# Patient Record
Sex: Female | Born: 1935
Health system: Southern US, Community
[De-identification: ages and names within clinical notes are randomized; demographics above are authoritative.]

## PROBLEM LIST (undated history)

## (undated) DIAGNOSIS — I509 Heart failure, unspecified: Secondary | ICD-10-CM

## (undated) DIAGNOSIS — I10 Essential (primary) hypertension: Secondary | ICD-10-CM

## (undated) DIAGNOSIS — M1990 Unspecified osteoarthritis, unspecified site: Secondary | ICD-10-CM

## (undated) DIAGNOSIS — N184 Chronic kidney disease, stage 4 (severe): Secondary | ICD-10-CM

## (undated) DIAGNOSIS — K219 Gastro-esophageal reflux disease without esophagitis: Secondary | ICD-10-CM

## (undated) DIAGNOSIS — F0390 Unspecified dementia without behavioral disturbance: Secondary | ICD-10-CM

## (undated) HISTORY — PX: CHOLECYSTECTOMY: SHX55

---

## 1997-09-03 ENCOUNTER — Encounter (HOSPITAL_COMMUNITY): Admission: RE | Admit: 1997-09-03 | Discharge: 1997-12-02 | Payer: Self-pay | Admitting: Internal Medicine

## 1998-01-14 ENCOUNTER — Ambulatory Visit (HOSPITAL_COMMUNITY): Admission: RE | Admit: 1998-01-14 | Discharge: 1998-01-14 | Payer: Self-pay | Admitting: Obstetrics & Gynecology

## 1998-01-22 ENCOUNTER — Other Ambulatory Visit: Admission: RE | Admit: 1998-01-22 | Discharge: 1998-01-22 | Payer: Self-pay | Admitting: Obstetrics & Gynecology

## 1998-07-18 ENCOUNTER — Ambulatory Visit (HOSPITAL_COMMUNITY): Admission: RE | Admit: 1998-07-18 | Discharge: 1998-07-18 | Payer: Self-pay | Admitting: Neurology

## 1998-07-18 ENCOUNTER — Encounter: Payer: Self-pay | Admitting: Pediatrics

## 1998-07-18 ENCOUNTER — Emergency Department (HOSPITAL_COMMUNITY): Admission: EM | Admit: 1998-07-18 | Discharge: 1998-07-18 | Payer: Self-pay | Admitting: Emergency Medicine

## 1998-07-20 ENCOUNTER — Ambulatory Visit (HOSPITAL_COMMUNITY): Admission: RE | Admit: 1998-07-20 | Discharge: 1998-07-20 | Payer: Self-pay | Admitting: Pediatrics

## 1998-07-21 ENCOUNTER — Ambulatory Visit (HOSPITAL_COMMUNITY): Admission: RE | Admit: 1998-07-21 | Discharge: 1998-07-21 | Payer: Self-pay | Admitting: Pediatrics

## 1998-07-23 ENCOUNTER — Ambulatory Visit (HOSPITAL_COMMUNITY): Admission: RE | Admit: 1998-07-23 | Discharge: 1998-07-23 | Payer: Self-pay | Admitting: Pediatrics

## 1998-07-30 ENCOUNTER — Ambulatory Visit (HOSPITAL_COMMUNITY): Admission: RE | Admit: 1998-07-30 | Discharge: 1998-07-30 | Payer: Self-pay | Admitting: Pediatrics

## 1999-01-19 ENCOUNTER — Encounter: Payer: Self-pay | Admitting: Obstetrics & Gynecology

## 1999-01-19 ENCOUNTER — Ambulatory Visit (HOSPITAL_COMMUNITY): Admission: RE | Admit: 1999-01-19 | Discharge: 1999-01-19 | Payer: Self-pay | Admitting: Obstetrics & Gynecology

## 1999-01-20 ENCOUNTER — Encounter: Payer: Self-pay | Admitting: Obstetrics & Gynecology

## 1999-01-20 ENCOUNTER — Ambulatory Visit (HOSPITAL_COMMUNITY): Admission: RE | Admit: 1999-01-20 | Discharge: 1999-01-20 | Payer: Self-pay | Admitting: Obstetrics & Gynecology

## 1999-01-22 ENCOUNTER — Other Ambulatory Visit: Admission: RE | Admit: 1999-01-22 | Discharge: 1999-01-22 | Payer: Self-pay | Admitting: Obstetrics and Gynecology

## 2000-02-15 ENCOUNTER — Ambulatory Visit (HOSPITAL_COMMUNITY): Admission: RE | Admit: 2000-02-15 | Discharge: 2000-02-15 | Payer: Self-pay | Admitting: Obstetrics and Gynecology

## 2000-02-15 ENCOUNTER — Encounter: Payer: Self-pay | Admitting: Obstetrics and Gynecology

## 2000-03-01 ENCOUNTER — Encounter: Payer: Self-pay | Admitting: Internal Medicine

## 2000-03-01 ENCOUNTER — Encounter: Admission: RE | Admit: 2000-03-01 | Discharge: 2000-03-01 | Payer: Self-pay | Admitting: Internal Medicine

## 2000-03-31 ENCOUNTER — Other Ambulatory Visit: Admission: RE | Admit: 2000-03-31 | Discharge: 2000-03-31 | Payer: Self-pay | Admitting: Obstetrics and Gynecology

## 2000-04-30 ENCOUNTER — Encounter: Payer: Self-pay | Admitting: Internal Medicine

## 2000-04-30 ENCOUNTER — Inpatient Hospital Stay (HOSPITAL_COMMUNITY): Admission: EM | Admit: 2000-04-30 | Discharge: 2000-05-12 | Payer: Self-pay | Admitting: Emergency Medicine

## 2000-04-30 ENCOUNTER — Encounter (INDEPENDENT_AMBULATORY_CARE_PROVIDER_SITE_OTHER): Payer: Self-pay | Admitting: *Deleted

## 2000-04-30 ENCOUNTER — Encounter: Payer: Self-pay | Admitting: Emergency Medicine

## 2000-05-01 ENCOUNTER — Encounter: Payer: Self-pay | Admitting: Internal Medicine

## 2000-05-12 ENCOUNTER — Encounter: Payer: Self-pay | Admitting: Internal Medicine

## 2000-05-12 ENCOUNTER — Inpatient Hospital Stay: Admission: RE | Admit: 2000-05-12 | Discharge: 2000-05-18 | Payer: Self-pay | Admitting: Internal Medicine

## 2000-08-28 ENCOUNTER — Encounter: Payer: Self-pay | Admitting: Emergency Medicine

## 2000-08-28 ENCOUNTER — Inpatient Hospital Stay (HOSPITAL_COMMUNITY): Admission: EM | Admit: 2000-08-28 | Discharge: 2000-08-31 | Payer: Self-pay | Admitting: Emergency Medicine

## 2000-08-29 ENCOUNTER — Encounter: Payer: Self-pay | Admitting: Internal Medicine

## 2000-08-30 ENCOUNTER — Encounter: Payer: Self-pay | Admitting: Internal Medicine

## 2000-08-31 ENCOUNTER — Encounter: Payer: Self-pay | Admitting: Internal Medicine

## 2000-09-02 ENCOUNTER — Encounter: Payer: Self-pay | Admitting: Emergency Medicine

## 2000-09-02 ENCOUNTER — Inpatient Hospital Stay (HOSPITAL_COMMUNITY): Admission: EM | Admit: 2000-09-02 | Discharge: 2000-09-06 | Payer: Self-pay | Admitting: Emergency Medicine

## 2000-09-03 ENCOUNTER — Encounter: Payer: Self-pay | Admitting: Internal Medicine

## 2000-09-04 ENCOUNTER — Encounter: Payer: Self-pay | Admitting: Internal Medicine

## 2004-03-22 ENCOUNTER — Emergency Department (HOSPITAL_COMMUNITY): Admission: EM | Admit: 2004-03-22 | Discharge: 2004-03-22 | Payer: Self-pay | Admitting: Emergency Medicine

## 2006-09-02 ENCOUNTER — Ambulatory Visit: Payer: Self-pay | Admitting: Gastroenterology

## 2006-09-02 LAB — CONVERTED CEMR LAB
AST: 121 units/L — ABNORMAL HIGH (ref 0–37)
Amylase: 57 units/L (ref 27–131)
BUN: 18 mg/dL (ref 6–23)
Basophils Relative: 0.5 % (ref 0.0–1.0)
Calcium: 9.2 mg/dL (ref 8.4–10.5)
Chloride: 106 meq/L (ref 96–112)
Creatinine, Ser: 1.7 mg/dL — ABNORMAL HIGH (ref 0.4–1.2)
Eosinophils Absolute: 0.2 10*3/uL (ref 0.0–0.6)
GFR calc non Af Amer: 31 mL/min
Glucose, Bld: 78 mg/dL (ref 70–99)
Lipase: 29 units/L (ref 11.0–59.0)
Lymphocytes Relative: 26.3 % (ref 12.0–46.0)
MCHC: 34.9 g/dL (ref 30.0–36.0)
MCV: 89.3 fL (ref 78.0–100.0)
Monocytes Relative: 11.7 % — ABNORMAL HIGH (ref 3.0–11.0)
RBC: 5 M/uL (ref 3.87–5.11)
Sed Rate: 58 mm/hr — ABNORMAL HIGH (ref 0–25)
Sodium: 142 meq/L (ref 135–145)
Total Bilirubin: 2.1 mg/dL — ABNORMAL HIGH (ref 0.3–1.2)
Vitamin B-12: 1335 pg/mL — ABNORMAL HIGH (ref 211–911)
WBC: 7.5 10*3/uL (ref 4.5–10.5)

## 2006-09-07 ENCOUNTER — Ambulatory Visit: Payer: Self-pay | Admitting: Gastroenterology

## 2006-09-08 ENCOUNTER — Ambulatory Visit: Payer: Self-pay | Admitting: Gastroenterology

## 2006-09-12 ENCOUNTER — Encounter: Payer: Self-pay | Admitting: Gastroenterology

## 2006-09-13 ENCOUNTER — Inpatient Hospital Stay (HOSPITAL_COMMUNITY): Admission: RE | Admit: 2006-09-13 | Discharge: 2006-09-18 | Payer: Self-pay | Admitting: Gastroenterology

## 2006-09-13 ENCOUNTER — Encounter (INDEPENDENT_AMBULATORY_CARE_PROVIDER_SITE_OTHER): Payer: Self-pay | Admitting: Specialist

## 2006-09-15 ENCOUNTER — Ambulatory Visit: Payer: Self-pay | Admitting: Gastroenterology

## 2006-11-01 ENCOUNTER — Ambulatory Visit: Payer: Self-pay | Admitting: Gastroenterology

## 2008-11-13 ENCOUNTER — Ambulatory Visit: Payer: Self-pay | Admitting: Vascular Surgery

## 2009-05-16 ENCOUNTER — Emergency Department (HOSPITAL_COMMUNITY): Admission: EM | Admit: 2009-05-16 | Discharge: 2009-05-16 | Payer: Self-pay | Admitting: Emergency Medicine

## 2009-11-08 ENCOUNTER — Inpatient Hospital Stay (HOSPITAL_COMMUNITY): Admission: EM | Admit: 2009-11-08 | Discharge: 2009-11-12 | Payer: Self-pay | Admitting: Emergency Medicine

## 2009-11-08 ENCOUNTER — Ambulatory Visit: Payer: Self-pay | Admitting: Gastroenterology

## 2009-11-08 ENCOUNTER — Encounter: Payer: Self-pay | Admitting: Gastroenterology

## 2009-11-09 ENCOUNTER — Encounter: Payer: Self-pay | Admitting: Gastroenterology

## 2009-11-12 ENCOUNTER — Encounter (INDEPENDENT_AMBULATORY_CARE_PROVIDER_SITE_OTHER): Payer: Self-pay | Admitting: *Deleted

## 2010-07-07 NOTE — Procedures (Signed)
Summary: ERCP  Patient: [name] Note: All result statuses are Final unless otherwise noted.  Tests: (1) ERCP (ERC)   ERC ERCP                  DONE     [facility]     [address]     [place], [place]  [address]           ERCP PROCEDURE REPORT           PATIENT:  [name], [name]  MR#:  [id number]     BIRTHDATE:  10/08/1923  GENDER:  female     ENDOSCOPIST:  Rachael Fee, MD     PROCEDURE DATE:  11/09/2009     PROCEDURE:  ERCP with removal of stones, ERCP with sphincterotomy     INDICATIONS:  h/o CBD stones (ERCP with DR. Arlyce Dice in 2008,     followed by lap chole); now with acute abd pains, elevated LFTs,     MRCP confirming CBD stone/debris           MEDICATIONS:  Fentanyl 80 mcg IV, Versed 8 mg IV, Benadryl 25 mg     IV, cipro 400mg  IV     TOPICAL ANESTHETIC:  none           DESCRIPTION OF PROCEDURE:   After the risks benefits and     alternatives of the procedure were thoroughly explained, informed     consent was obtained.  The [id number]-[id number] ([id number]) endoscope was     introduced through the mouth and advanced to the second portion of     the duodenum without detailed examination of the UGI tract. The     major papilla showed evidence of previous sphincterotomy.  The     bile duct was cannulated using a 44 Autotome over a .035 hydrawire     and then contrast was injected.  Cholangiogram revealed large,     mobile filling defect in 12mm CBD.  Intrahepatic were slightly     dilated.  The previous biliary sphincterotomy was extended and     then a biliary balloon was then used to sweep a large amount of     soft, greenish debris/amorphous stone from the bile duct into the     duodenum.  Severeal sweeps with the balloon were performed,     however before completion, occlusion cholangiogram was possible     she began to desaturate and the scope was then completely     withdrawn from the patient and the procedure terminated.  The main     pancreatic  duct was never cannulated nor injected with dye.           <<PROCEDUREIMAGES>>           Impression:     CBD stone/large amount of semi-formed debris was swept from bile     duct following extension of the previous biliary sphincterotomy.     She began to desaturate prior to completion, occlusion     cholangiogram but I think that the vast majority, if not all, of     the stone/debris was swept from duct.           Plan:     She should stay on IV cipro another 24 to 48 hours.  Will follow     liver tests and clinical progress.           ______________________________     Chloe Alar.  Chloe Hartigan, MD           cc: Melvia Heaps, MD           n.     Rosalie Doctor:   Rachael Fee at 11/09/2009 11:11 AM           [place], [place], [phone]  Note: An exclamation mark (!) indicates a result that was not dispersed into the flowsheet. Document Creation Date: 11/09/2009 11:11 AM _______________________________________________________________________  (1) Order result status: Final Collection or observation date-time: 11/09/2009 10:59 Requested date-time:  Receipt date-time:  Reported date-time:  Referring Physician:   Ordering Physician: Rob Bunting [phone]) Specimen Source:  Source: Launa Grill Order Number: [id number] Lab site:

## 2010-07-07 NOTE — Procedures (Signed)
Summary: ERCP   ERCP  Procedure date:  09/12/2006  Findings:      abnormal:  Location: [facility].   ERCP  Procedure date:  09/12/2006  Findings:      abnormal:  Location: [facility].  Patient Name: [name], [name] v. MRN:  Procedure Procedures: Therapeutic Endoscopic Retrograde Cholangiopancreatography CPT: 43260.    with SphincterotomyCPT: [id number].    With Stone Removal CPT: S6289224.  Personnel: Endoscopist: Barbette Hair. Arlyce Dice, MD.  Indications Symptoms: Abdominal pain.  History  Current Medications: Patient is not currently taking Coumadin.  Pre-Exam Physical: Performed Sep 12, 2006. Entire physical exam was normal.  Exam Monitoring: Pulse and BP monitoring done. Oximetry used. Supplemental O2 given. at 2 Liters.  Sedation Meds: Robinul 0.2 given IV. Versed 10 mg. given IV. Fentanyl 140 given IV. Cetacaine Spray 2 sprays given aerosolized. Glucagon 1 mg. given IV.  Pre-Op Antibiotics: Cipro/Ciprofloxacin 400 mg daily.  Fluoroscopy: Fluoroscopy was used.  VISUALIZATION  Ducts: Common Bile Duct, Left Hepatic Duct, Right Hepatic Duct visualized. Intrahepatic DuctsPancreatic Duct not sought.  Findings STONE(S) : Common Bile Duct,  ICD9: Choledocholithiasis: 574.50. Comments: At least 4 5-70mm filling defects.  Following a 13mm sphincterotomy, stones were extracted with a 12.35mm balloon extraction basket.  STONE(S) : Common Bile Duct,  ICD9: Choledocholithiasis: 574.50.  STONE(S) : [typed in],  ICD9: Choledocholithiasis: 574.50.  STONE(S) : Common Bile Duct,  ICD9: Choledocholithiasis: 574.50.   Assessment ERCP: Exam complete, Abnormal examination, see findings above.  Biliary Tract: Exam complete. Abnormal examination, see findings above.  Events  Unplanned Intervention: No intervention was required.  Unplanned Events: There were no complications. Plans Medication Plan: Continue current medications.  Scheduling: Surgery, on Sep 13, 2006.    This report was created from the original endoscopy report, which was reviewed and signed by the above listed endoscopist.

## 2010-07-07 NOTE — Discharge Summary (Signed)
Summary: Jaundice   NAME:  [name], [name]              ACCOUNT NO.:  [id number]      MEDICAL RECORD NO.:  [id number]          PATIENT TYPE:  INP      LOCATION:  5502                         FACILITY:  [facility]      PHYSICIAN:  Hind I Elsaid, MD      DATE OF BIRTH:  1923/06/12      DATE OF ADMISSION:  11/07/2009   DATE OF DISCHARGE:  11/11/2009                                  DISCHARGE SUMMARY      PRIMARY CARE PHYSICIAN:  Cala Bradford R. Renae Gloss, MD      DISCHARGE DIAGNOSES:   1. Jaundice felt to be secondary to common bile duct cholelithiasis       and cholecystitis.   2. Leukocytosis felt to be secondary to jaundice.   3. Fever felt to be secondary to jaundice.   4. Chronic renal insufficiency.   5. History of Bell palsy.   6. History of benign breast mass.   7. History of de Quervain thyroiditis.   8. History of hypertension.   9. History of hypercholesterolemia.   10.History of congestive heart failure.   11.Hypoxia during this hospital visit felt to be secondary to fluid       overload.      DISCHARGE MEDICATIONS:   1. Norvasc 10 mg daily.   2. Tramadol 50 mg p.o. b.i.d. p.r.n.   3. Lasix 20 mg p.o. daily.   4. Simvastatin 20 mg daily.   5. Lopressor 50 mg b.i.d.   6. Prilosec 20 mg daily.   7. Cipro 500 mg p.o. b.i.d. for 10 days.      CONSULTATION:  Gastroenterology consulted.      PROCEDURE:   1. CT abdomen and pelvis, status post cholecystectomy with intra and       extrahepatic biliary ductal dilatation.  This can be seen after       cholecystectomy.  However, correlation with LFTs and bilirubin is       recommended.   2. Hiatal hernia with fluid with distal thoracic esophagus adjusting       gastroesophageal reflux.   3. Anterior right lower lobe collapse/consolidation suggesting       pneumonia.   4. Chest x-ray, cardiomegaly was diffused interstitial intubation most       consistent with interstitial edema, most likely superimposed       interstitial fibrotic  changes and bilateral.   5. Status post MRCP.   6. Status post ERCP.  MRCP did show stone on the common bile duct       causing intra and extrahepatic biliary dilatation.      HISTORY OF PRESENT ILLNESS:  This is an 77 year old female with history   of hypercholesterolemia who present with 2-day history of abdominal   pain, nausea, and vomiting.  Denies any fever, chills, or diarrhea.  She   is found to have elevated liver function test along with elevated   alkaline phosphatase.  Her CT scan show no masses.  On abdominal exam   had kind of tenderness at the left  upper quadrant.  The patient admitted   for old jaundice to the hospital.  An MRCP was done, which shows common   bile stone.  The patient covered with Cipro IV, and blood culture was   negative.  After the ERCP, the patient's jaundice gradually improving.   The patient noticed to have significant leukocytosis 20,000, which felt   could be secondary to her cholecystitis, but cannot rule out pneumonia.   I repeat chest x-ray suggestive of pulmonary or interstitial edema   rather than pneumonia.  Any how, the patient covered with Cipro IV and   the patient received Lasix IV.  Our plan to answer the patient 24 hours   for decrease on the patient white blood cells, and weaning off oxygen.   To continue with Cipro, the patient anticipates to be discharged   tomorrow.  Further medication or further recommendation can be added to   the above discharge summary per the discharging MD.               Hind Bosie Helper, MD            HIE/MEDQ  D:  11/11/2009  T:  11/12/2009  Job:  [id number]      Electronically Signed by Ebony Cargo MD on 12/23/2009 11:45:56 AM

## 2010-08-24 LAB — CBC
HCT: 37.2 % (ref 36.0–46.0)
HCT: 38.5 % (ref 36.0–46.0)
HCT: 39.7 % (ref 36.0–46.0)
HCT: 49.6 % — ABNORMAL HIGH (ref 36.0–46.0)
Hemoglobin: 13.6 g/dL (ref 12.0–15.0)
Hemoglobin: 13.8 g/dL (ref 12.0–15.0)
Hemoglobin: 14.1 g/dL (ref 12.0–15.0)
Hemoglobin: 17.3 g/dL — ABNORMAL HIGH (ref 12.0–15.0)
MCHC: 34.3 g/dL (ref 30.0–36.0)
MCHC: 34.8 g/dL (ref 30.0–36.0)
MCV: 89.7 fL (ref 78.0–100.0)
MCV: 90.6 fL (ref 78.0–100.0)
MCV: 90.7 fL (ref 78.0–100.0)
MCV: 90.9 fL (ref 78.0–100.0)
Platelets: 123 10*3/uL — ABNORMAL LOW (ref 150–400)
Platelets: 137 10*3/uL — ABNORMAL LOW (ref 150–400)
Platelets: 172 10*3/uL (ref 150–400)
RDW: 13.9 % (ref 11.5–15.5)
RDW: 14.7 % (ref 11.5–15.5)
RDW: 14.9 % (ref 11.5–15.5)
RDW: 15.1 % (ref 11.5–15.5)
WBC: 17.4 10*3/uL — ABNORMAL HIGH (ref 4.0–10.5)

## 2010-08-24 LAB — URINALYSIS, MICROSCOPIC ONLY
Ketones, ur: NEGATIVE mg/dL
Nitrite: NEGATIVE
Protein, ur: NEGATIVE mg/dL

## 2010-08-24 LAB — PROTIME-INR
INR: 1.06 (ref 0.00–1.49)
Prothrombin Time: 13.7 s (ref 11.6–15.2)

## 2010-08-24 LAB — COMPREHENSIVE METABOLIC PANEL
ALT: 138 U/L — ABNORMAL HIGH (ref 0–35)
ALT: 262 U/L — ABNORMAL HIGH (ref 0–35)
AST: 110 U/L — ABNORMAL HIGH (ref 0–37)
Albumin: 2.4 g/dL — ABNORMAL LOW (ref 3.5–5.2)
Albumin: 4.1 g/dL (ref 3.5–5.2)
BUN: 21 mg/dL (ref 6–23)
CO2: 24 mEq/L (ref 19–32)
Calcium: 7.9 mg/dL — ABNORMAL LOW (ref 8.4–10.5)
Chloride: 107 mEq/L (ref 96–112)
Chloride: 98 mEq/L (ref 96–112)
Creatinine, Ser: 1.61 mg/dL — ABNORMAL HIGH (ref 0.4–1.2)
Creatinine, Ser: 1.79 mg/dL — ABNORMAL HIGH (ref 0.4–1.2)
GFR calc Af Amer: 26 mL/min — ABNORMAL LOW (ref >60)
GFR calc non Af Amer: 22 mL/min — ABNORMAL LOW (ref >60)
GFR calc non Af Amer: 30 mL/min — ABNORMAL LOW (ref >60)
Potassium: 3.4 mEq/L — ABNORMAL LOW (ref 3.5–5.1)
Potassium: 3.7 mEq/L (ref 3.5–5.1)
Sodium: 135 mEq/L (ref 135–145)
Sodium: 140 mEq/L (ref 135–145)
Total Bilirubin: 9.1 mg/dL — ABNORMAL HIGH (ref 0.3–1.2)
Total Protein: 5.5 g/dL — ABNORMAL LOW (ref 6.0–8.3)
Total Protein: 8.3 g/dL (ref 6.0–8.3)

## 2010-08-24 LAB — HEPATITIS PANEL, ACUTE
HCV Ab: NEGATIVE
Hep A IgM: NEGATIVE
Hep B C IgM: NEGATIVE
Hepatitis B Surface Ag: NEGATIVE

## 2010-08-24 LAB — BASIC METABOLIC PANEL
BUN: 17 mg/dL (ref 6–23)
BUN: 23 mg/dL (ref 6–23)
Calcium: 8.6 mg/dL (ref 8.4–10.5)
Chloride: 111 mEq/L (ref 96–112)
Creatinine, Ser: 1.68 mg/dL — ABNORMAL HIGH (ref 0.4–1.2)
GFR calc non Af Amer: 29 mL/min — ABNORMAL LOW (ref >60)
Glucose, Bld: 96 mg/dL (ref 70–99)
Glucose, Bld: 98 mg/dL (ref 70–99)
Potassium: 3.5 mEq/L (ref 3.5–5.1)
Sodium: 142 mEq/L (ref 135–145)

## 2010-08-24 LAB — CULTURE, BLOOD (ROUTINE X 2)
Culture: NO GROWTH
Culture: NO GROWTH

## 2010-08-24 LAB — HEPATIC FUNCTION PANEL
ALT: 85 U/L — ABNORMAL HIGH (ref 0–35)
AST: 33 U/L (ref 0–37)
Albumin: 2.5 g/dL — ABNORMAL LOW (ref 3.5–5.2)
Bilirubin, Direct: 4.4 mg/dL — ABNORMAL HIGH (ref 0.0–0.3)
Indirect Bilirubin: 3.5 mg/dL — ABNORMAL HIGH (ref 0.3–0.9)
Total Bilirubin: 7.9 mg/dL — ABNORMAL HIGH (ref 0.3–1.2)
Total Protein: 6.3 g/dL (ref 6.0–8.3)

## 2010-08-24 LAB — URINE CULTURE
Colony Count: NO GROWTH
Culture: NO GROWTH

## 2010-08-24 LAB — URINALYSIS, ROUTINE W REFLEX MICROSCOPIC
Glucose, UA: 100 mg/dL — AB
Nitrite: NEGATIVE
Protein, ur: 100 mg/dL — AB
Urobilinogen, UA: 1 mg/dL (ref 0.0–1.0)

## 2010-08-24 LAB — DIFFERENTIAL
Basophils Absolute: 0 10*3/uL (ref 0.0–0.1)
Basophils Relative: 0 % (ref 0–1)
Eosinophils Absolute: 0 10*3/uL (ref 0.0–0.7)
Neutro Abs: 9.5 10*3/uL — ABNORMAL HIGH (ref 1.7–7.7)

## 2010-08-24 LAB — LIPASE, BLOOD: Lipase: 22 U/L (ref 11–59)

## 2010-08-24 LAB — GLUCOSE, CAPILLARY
Glucose-Capillary: 135 mg/dL — ABNORMAL HIGH (ref 70–99)
Glucose-Capillary: 143 mg/dL — ABNORMAL HIGH (ref 70–99)

## 2010-08-24 LAB — HEPATITIS B SURFACE ANTIGEN: Hepatitis B Surface Ag: NEGATIVE

## 2010-08-24 LAB — URINE MICROSCOPIC-ADD ON

## 2010-09-08 LAB — URINE CULTURE: Culture: NO GROWTH

## 2010-09-08 LAB — URINALYSIS, ROUTINE W REFLEX MICROSCOPIC
Glucose, UA: NEGATIVE mg/dL
Hgb urine dipstick: NEGATIVE
Specific Gravity, Urine: 1.015 (ref 1.005–1.030)
pH: 7.5 (ref 5.0–8.0)

## 2010-10-20 NOTE — Consult Note (Signed)
NEW PATIENT CONSULTATION   [name], CAPETILLO  DOB:  [birth year]                                       11/13/2008  HYQMV#:[id number]   The patient presents today for evaluation of bilateral lower extremity  venous pathology.  She is a pleasant 77 year old white female with a  long history of venous pathology.  She is status post bilateral vein  stripping in [place] at least 30 years ago.  She had noticed over the  last several years to have increasing tributary varicosities over her  right calf and has an area of particular concern with a small varicosity  that is very prominent in her medial calf.  She denies any prior history  of deep vein thrombosis, bleeding, ulceration, or superficial  thrombophlebitis.  She does have significant bilateral calf swelling and  is having some improvement with diuretic treatment.  She does have a  history of high blood pressure and elevated cholesterol.   SOCIAL HISTORY:  She is widowed.  She does not smoke or drink alcohol.  She does have a family history of venous varicosities.   REVIEW OF SYSTEMS:  Positive for urinary frequency, leg pain, arthritic  joint pain, hearing loss.   MEDICATIONS:  Prilosec, amlodipine, Lopressor, Darvocet, simvastatin,  microbid, Lasix, Fosamax.   PHYSICAL EXAM:  Well-developed, moderately obese white female appearing  stated age of 57.  Blood pressure is 136/16, pulse 58, respirations 18.  Radial pulses are 2+.  She has 2+ dorsalis pedis pulses bilaterally.  She does have significant bilateral lower extremity pitting edema and  does have multiple prior incisions from multiple prior tributary  removal.  She also has an incision at her saphenous vein at her ankles  bilaterally.  She has a few scattered telangiectasia and varices over  her left leg.  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   She underwent formal venous duplex in our office  that shows no evidence  of deep vein thrombosis on the right leg.  She does have reflux in her  great saphenous vein and tributary veins.  On the left leg, I do not see  any evidence of saphenous vein, appears to be surgically absent.  On the  right leg she does have surgically absent saphenous vein from the knee  distal to the ankle.  I discussed options with the patient and her  daughter present.  I did explain the importance of continued diuretic  treatment, elevation, and compression garments.  She does have knee-high  compression garments, but has typical difficulty in placing these on.  I  did explain the option of ablation of her saphenous vein and stab  phlebectomy of tributary varicosities should she have progressive  symptoms, but these currently are not severe.  She understood this  discussion.  I did explain that she does not have any evidence of any  danger related to this, and therefore would recommend conservative  treatment.  She will notify us should she have any progressive symptoms.   Larina Earthly, M.D.  Electronically Signed   TFE/MEDQ  D:  11/13/2008  T:  11/13/2008  Job:  [id number]   cc:   Merlene Laughter. Renae Gloss, M.D.

## 2010-10-20 NOTE — Procedures (Signed)
LOWER EXTREMITY VENOUS REFLUX EXAM   INDICATION:  Right lower extremity varicose vein.   EXAM:  Using color-flow imaging and pulse Doppler spectral analysis, the  right common femoral, superficial femoral, popliteal, posterior tibial,  greater and lesser saphenous veins are evaluated.  There is no evidence  suggesting deep venous insufficiency in the right lower extremity.   The right saphenofemoral junction is small in size.  The right GSV is  not competent from proximal to knee level with the caliber as described  below.   The right proximal short saphenous vein demonstrates incompetency with  caliber ranging from 0.56 to 0.33 cm.   GSV Diameter (used if found to be incompetent only)                                            Right    Left  Proximal Greater Saphenous Vein           0.58 cm  cm  Proximal-to-mid-thigh                     cm       cm  Mid thigh                                 0.45 cm  cm  Mid-distal thigh                          cm       cm  Distal thigh                              0.32 cm  cm  Knee                                      0.36 cm  cm   IMPRESSION:  1. The right greater saphenous vein reflux is identified with the      caliber ranging from 0.58 cm to 0.32 cm knee to proximal thigh.  2. The right greater saphenous vein is not aneurysmal.  3. The right greater saphenous vein is not tortuous.  4. The deep venous system is competent.  5. The right lesser saphenous vein is not competent.        ___________________________________________  Larina Earthly, M.D.   AC/MEDQ  D:  11/13/2008  T:  11/13/2008  Job:  [id number]

## 2010-10-23 NOTE — H&P (Signed)
[place]  Patient:    [name], [name]                     MRN: [id number] Adm. Date:  [id number] Attending:  Olene Craven CC:         Kern Reap, M.D.   History and Physical  CHIEF COMPLAINT: Congestive heart failure.  HISTORY OF PRESENT ILLNESS: This 77 year old patient has a long history of multiple medical problems and was hospitalized in November and December 2001 here at [facility] for Escherichia coli pyelonephritis and sepsis with acute renal failure, diabetes, hypothyroidism, electrolyte disorder, and hypertension.  She had transient elevations of liver enzymes. She had a coagulopathy from Coumadin and it was discontinued.  She has a previous history of deep vein thrombosis and pulmonary embolus.  She has a history of osteoarthritis and a history of dyspepsia and Bells palsy.  ALLERGIES:  1. PENICILLIN.  2. SULFA.  PAST MEDICAL HISTORY: The complexity of her past history is outlined in the notes at [facility].  ACUTE PROBLEM: Shortness of breath.  SUBJECTIVE: This patient woke up this morning without chest pain and was short of breath.  She had no leg pain, no cough, no coughing up blood or hemoptysis or wheezing.  She was seen by the emergency room staff here at [facility] and found to be in congestive heart failure, and I was called to admit the patient.  PHYSICAL EXAMINATION:  VITAL SIGNS: Blood pressure 135/70, respirations 18 and unlabored, pulse 75 and regular, temperature normal.  HEENT: Head normal.  PERRL.  EOMI.  ENT examination was unremarkable for any significant abnormalities.  Funduscopic examination was negative.  The patient does not have any lower teeth.  She did have bilateral cerumen impactions.  NECK: Supple, full range of motion.  Carotids were extremely weak bilaterally but there were no carotid bruits noted.  There were no neck  masses.  The thyroid was unremarkable.  There was no jugular venous distention.  CHEST: Bilateral rales in the mid chest noted.  BREAST: Unremarkable.  CARDIOVASCULAR: Rate and rhythm normal without murmur or gallop.  ABDOMEN: Soft, flat, nontender.  No organomegaly or abnormal masses.  Bowel sounds normal.  No abdominal bruits.  Postsurgical scars noted.  SKIN: Warm and dry.  No evidence of jaundice.  JOINT SURVEY: Hypertrophic changes, specifically of both knees.  The right knee had full extension and flexion to 90 degrees with mild discomfort.  The left knee had full extension but flexion only to about 5 degrees with discomfort.  There was no acute inflammatory reaction or effusion.  The patient is status post bilateral venous ligation and stripping.  There is 1+ pretibial edema bilaterally.  Arterial circulation was 2+ bilaterally.  NEUROLOGIC: Negative.  RECTAL/PELVIC: Examinations are deferred.  LABORATORY DATA: EKG showed no acute changes.  Chest x-ray showed pulmonary edema.  Initial laboratory work was abnormal with a BUN of 35, creatinine 1.6.  The glucose was good at 139.  CBC, troponin, and CK are all pending.  ASSESSMENT: Acute pulmonary edema, rule out acute myocardial infarction silently as an etiology.  Certainly no evidence of pulmonary embolus.  PLAN: Admit the patient and diurese, ACE inhibitor, rule out MI. DD:  08/28/00 TD:  08/29/00 Job: [id number]

## 2010-10-23 NOTE — H&P (Signed)
[place]. [facility]  Patient:    [name], [name]                     MRN: [id number] Adm. Date:  [id number] Disc. Date: [id number] Attending:  Rosanne Sack                         History and Physical  DATE OF BIRTH:  1924-05-25.  PROBLEM LIST  1. Recent Escherichia coli pyelonephritis with septicemia, complicated with     acute renal failure.     a. Day 12 out of 14 of antibiotic therapy.  2. Acute-on-chronic renal failure.     a. Multifactorial etiology -- pyelonephritis, prerenal azotemia,        nonsteroidal anti-inflammatory drug use, hypertension and diabetes.     b. Current creatinine 1.8; BUN 20.     c. Protein of 1500 mg on a 24-hour urine collection for protein.     d. Negative C3, C4, antinuclear antibodies and urine for eosinophils.     e. Renal ultrasound negative except for renal size, right 12.3 cm, left        10.1.  3. Newly diagnosed uncontrolled type 2 diabetes mellitus.     a. Hemoglobin A1c 6.2%.  4. Probable hypothyroidism.     a. Thyroid-stimulating hormone 0.2, free T3 40.8, free T4 1.35.     b. Thyroid scan pending.  5. Hypokalemia.  6. Anemia of chronic disease, hemoglobin 10.6, MCV 90.  7. Hypertension.  8. Transient abnormal liver function tests with cholelithiasis, negative     PIPIDA scan and ultrasound of the liver.  9. Transient coagulopathy secondary to Coumadin toxicity, resolved. 10. History of deep venous thrombosis in 1995, on Coumadin since then, until     April 30, 2000. 11. Osteoarthritis. 12. History of dyspepsia. 13. History of Bells palsy in January 2000. 14. History of benign tumors x 2, left breast, in the past. 15. Allergies to penicillin, sulfa drugs (rash).  CHIEF COMPLAINT:  Generalized weakness.  HISTORY OF PRESENT ILLNESS:  [name] is a very pleasant 77 year old female being transferred to the subacute unit after being admitted to [facility] on April 30, 2000  with acute renal failure, E. coli pyelonephritis, coagulopathy, uncontrolled type 2 diabetes mellitus. Currently, the patient is being transferred to continue with the ongoing medical therapy and workup; also, she is supposed to receive physical and occupational therapy for lower extremity strengthening and increased ambulation skills.  The patient denies chest pain, shortness of breath, orthopnea, PND, focal weakness, swallowing problems, visual changes, lower back pain, urinary symptoms, diarrhea, constipation, nausea, vomiting, melena, tarry stools, bright red blood per rectum.  The patient described chronic intermittent lower extremity edema in both legs when she stands up; she also described chronic dyspepsia, though she has not had this problem during her recent hospital stay.  PAST MEDICAL HISTORY:  As problem list.  ALLERGIES:  As problem list.  MEDICATIONS  1. Iron sulfate 325 mg p.o. t.i.d.  2. Norvasc 10 mg p.o. q.d.  3. Tequin 400 mg p.o. q.d. x 2 more days.  4. Actos 45 mg p.o. q.d.  5. Amaryl 4 mg p.o. q.d.  6. K-Dur 20 mEq p.o. q.d.  FAMILY MEDICAL HISTORY:  Her brother had diabetes, a second brother had lung cancer.  No hypertension, strokes or coronary artery disease in the family.  SOCIAL HISTORY:  The patient is married and has  two granddaughters.  She was born in [place].  She does not smoke.  She drinks an occasional glass of wine with meals.  The patient is retired.  REVIEW OF SYSTEMS:  As HPI.  PHYSICAL EXAMINATION  VITAL SIGNS:  Afebrile.  Blood pressure 136/65, heart rate 80, respirations 20, oxygen saturation 98% on room air.  Weight 71 kg.  HEENT:  Normocephalic, atraumatic.  Nonicteric sclerae.  Conjunctivae within the normal limits.  PERRLA.  EOMI.  Funduscopic exam negative for papilledema or hemorrhages.  TMs is within the normal limits.  Moist mucous membranes. Oropharynx clear.  NECK:  Supple.  No JVD.  No bruits.  No thyromegaly.  No  adenopathy.  LUNGS:  Clear to auscultation bilaterally without crackles or wheezes.  Good air movement bilaterally.  CARDIAC:  Regular rate and rhythm with 1-2/6 systolic ejection murmur at the left lower sternal border and apex.  No S3.  Possible S4.  No rubs.  ABDOMEN:  Obese, nontender, nondistended.  Bowel sounds were present.  No hepatosplenomegaly.  No Murphys sign.  No bruits.  No masses.  BREASTS:  Exam within the normal limits.  GU:  Exam within the normal limits.  RECTAL:  Not done.  EXTREMITIES:  Trace to 1+ pitting edema bilaterally.  Pulses 2+ bilaterally. No clubbing or cyanosis.  NEUROLOGIC:  Alert and oriented x 3.  Cranial nerves II-XII intact.  Strength 5/5 in all extremities.  DTRs 3/5 in all extremities.  Plantar reflexes downgoing bilaterally.  Sensory intact.  LABORATORY DATA:  Sodium 138, potassium 3.3, chloride 102, CO2 26, BUN 20, creatinine 1.8, glucose 221.  Hemoglobin 10.6, MCV 90, WBC 11.5, platelets 577,000.  Guaiac-negative stools.  ASSESSMENT AND PLAN  1. Recent Escherichia coli pyelonephritis with septicemia:  Currently, the     patient is on oral antibiotic therapy; she is on day #12 of 14 of     therapy.  No complications are noticed at this point.  2. Acute-on-chronic renal insufficiency:  Patients renal function has shown     a steady improvement pattern over the last 12 days with supportive care.     The etiology seems to be multifactorial; a combination of pyelonephritis,     prerenal azotemia, nonsteroidal anti-inflammatory drug abuse, hypertension     and possible diabetes seem to be the most likely etiologies.  Given the     degree of proteinuria and the concurrent newly diagnosed diabetes, an     angiotensin II receptor blocker or an ACE inhibitor could be considered,     if the patients renal function were to remain stable and improve.  Her     renal function will be reassessed within two to three days.  3. Uncontrolled type 2  diabetes mellitus:  The patients capillary blood     glucoses remain in the 170 to 200 range.  Today, we are increasing the      Amaryl.  We will continue Actos and a sliding-scale insulin before meals     and at bedtime.  Further adjustments on these medications will be done by     our team, based upon the response of the hypoglycemia.  4. Rule out hypothyroidism.  The patients thyroid-stimulating hormone and     free T3 are consistent with hypothyroidism; also, the uncontrolled     hyperglycemia could be associated with this problem.  A thyroid scan is to     be performed tomorrow with an I-131 uptake.  We will be deciding about  evaluation versus medical therapy once the data is collected.  5. Hypokalemia:  Today, the potassium level remains 3.3 despite     supplementation.  A K-Dur dose of 40 mEq has been given today.  The serum     potassium level will be rechecked tomorrow, as well as a magnesium level.  6. Generalized weakness:  The patient is to received physical therapy and     occupational therapy by subacute care unit protocol to increase lower     extremity strength and ambulation skills.  7. Hypertension:  This problem remains stable with the current therapy. DD: 05/12/00 TD:  05/12/00 Job: [id number]

## 2010-10-23 NOTE — Discharge Summary (Signed)
[place]. [facility]  Patient:    [name], [name]                     MRN: [id number] Adm. Date:  [id number] Disc. Date: [id number] Attending:  Olene Craven CC:         Madaline Savage, M.D.   Discharge Summary  DATE OF BIRTH:  1924/03/24  DISCHARGE DIAGNOSES: 1. Congestive heart failure. 2. Type 2 diabetes. 3. Hyperlipidemia. 4. Hypertension. 5. History of lower extremity deep vein thrombosis, previously on Coumadin. 6. Osteoarthritis. 7. Renal insufficiency.  CONSULTATIONS:  [facility], Dr. Elsie Lincoln.  PROCEDURES: 1. The patient had a Persantine Cardiolite:  Negative for ischemia, negative    for wall motion abnormality, EF of 25%. 2. The patient had a 2-D echo:  No wall motion abnormality. 3. The patient also had lower extremity Dopplers:  No evidence of DVTs, SVTs,    or Bakers cyst.  DISCHARGE MEDICATIONS: 1. Amaryl 2 mg p.o. q.d. 2. Lopressor 25 mg p.o. q.d. 3. Prevacid 30 mg p.o. q.d. 4. Clonidine 0.1 mg p.o. b.i.d. 5. Norvasc 10 mg q.d. 6. Aspirin a day. 7. Vioxx 12.5 mg p.o. q.d. p.r.n. 8. Lipitor 10 mg p.o. q.h.s.  HOSPITAL COURSE:  The patient was admitted on August 28, 2000, by Dr. Elmore Guise for congestive heart failure of sudden onset.  The patient apparently woke up with a two-day history of increasing shortness of breath.  Initial chest x-ray indicated congestive heart failure.  She was admitted, ruled out with serial enzymes and started on gentle diuresis.  The patient responded to minimum IV Lasix with a good output with resolution of her CHF.  Based on her age, comorbid medical problems, it was elected to proceed with an ischemic workup. [facility] was called.  It was elected to do a Persantine Cardiolite, which showed an EF of 65% and no ischemia or wall motion abnormality.  A 2-D echo was unremarkable as well.  It was discussed this may have been related to the patients use of Actos.   Actos will be stopped at this time.  The patient did have an episode of hypoglycemia during the course of hospitalization.  Her Amaryl will be dropped to 2 mg p.o. q.d.  She is being discharged on August 31, 2000, with resolution of her CHF, symptoms improved, hypertension controlled, diabetes under control.  She is being discharged in stable condition.  She is to follow up with Dr. Kern Reap in 10-14 days and with [facility] on a p.r.n. basis. DD:  08/31/00 TD:  08/31/00 Job: [id number]

## 2010-10-23 NOTE — Discharge Summary (Signed)
[place]. [facility]  Patient:    [name], [name]                     MRN: [id number] Adm. Date:  [id number] Disc. Date: [id number] Attending:  Rosanne Sack CC:         [name], M.D.  [name], M.D.   Discharge Summary  DATE OF BIRTH:  [birth year].  DISCHARGE DIAGNOSES:  1. Acute renal failure on chronic renal insufficiency somewhat multifactorial     etiology.     A. Pyelonephritis, NSAID abuse, hypertension, and possible diabetes.     B. Admission creatinine 6.0, discharge 1.8.     C. Negative C3, C4, ANA and urine for eosinophils.     D. Twenty-four-hour urine for protein 1500 mg.     E. Renal ultrasound, no hydronephrosis, normal renal parenchyma, right        kidney 12.3 cm, left 10.1 cm.  2. Newly diagnosed uncontrolled type 2 diabetes mellitus.     A. Hemoglobin A1C 6.2%.  3. Probable hypothyroidism.     A. TSH 0.2, Free T3 40.8, Free T4 1.35.     B. Thyroid scan pending.  4. Hypokalemia, serum potassium level 3.3.  5. Anemia of chronic disease, hemoglobin 10.6, MCV 90.  6. Hypertension.  7. Slight abnormalities associated with cholelithiasis.     A. Negative gallbladder scan.     B. Negative signs of cholecystitis by ultrasound.  8. Transient coagulopathy secondary to Coumadin toxicity.  9. Osteoarthritis. 10. History of deep vein thrombosis in 1995, on Coumadin since then. 11. History of Bells palsy in January 2001. 12. History of dyspepsia. 13. History of left breast benign tumors x 2. 14. Allergies to PENICILLIN and SULFA drugs (thrush).  DISCHARGE MEDICATIONS:  1. Iron sulfate 325 mg p.o. t.i.d. with meals.  2. Norvasc 10 mg p.o. q.d.  3. Tequin 400 mg p.o. q.d. x 2 more days.  4. Actos 45 mg p.o. q.d.  5. Amaryl 4 mg p.o. q.d.  6. K-Dur 20 mEq p.o. q.d.  CONSULTATIONS:  1. Dr. Fayrene Fearing L. Mills, Nephrology.  2. Dr. Adelene Amas. Mills, Psychiatry.  DISCHARGE FOLLOW UP AND DISPOSITION:  Ms.  [name] is being transferred to the subacute unit to continue with her ongoing medical therapy and receive physical therapy for strengthening of the lower extremities.  The patient was being followed by Dr. Ellene Mills prior to admission.  The patient requested reassignment to a new primary care [name].  PROCEDURES:  1. Abdominal ultrasound, as above.  2. Gallbladder scan, negative.  DISCHARGE LABORATORY DATA:  TSH 0.2, Free T3 40.8, Free T4 1.35, hemoglobin A1C 6.2%, hemoglobin 10.6, MCV 90.  Platelets 577, wbcs 11.5.  Sodium 138, potassium 3.3, chloride 102, CO2 26, BUN 20, creatinine 1.8, glucose 221. A 24-hour urine for protein 1500 mg.  Negative C3, C4, ANA, and urine for eosinophils.  HOSPITAL COURSE:  Mrs. Reith is a very pleasant 77 year old female who presented on April 30, 2000 to [facility] Emergency Department with acute renal failure associated with pyelonephritis.  Please see admission H&P by Dr. Nolon Rod Mills for further details regarding the history of presentation, physical exam, and lab data.  #1 - ACUTE RENAL FAILURE ON CHRONIC RENAL INSUFFICIENCY:  The patients creatinine on admission was 6.0 with a BUN of 82.  No data up to today regarding the baseline renal function.  The patient was admitted for close monitoring.  Supportive care  was provided with IV fluids.  Renal ultrasound revealed no hydronephrosis.  The renal parenchyma was normal.  The right kidney was about 2 cm greater than the left.  Initially, a combination of pyelonephritis, the use of NSAIDs to treat osteoarthritis, prerenal azotemia and probable chronic renal insufficiency were suspected.  A urine for eosinophils was obtained with negative results.  With hydration, the creatinine improved to 5.0 on day #2 and on day #3 the creatinine increased to 5.6.  At this point, a renal consult was obtained for further input.  Other studies including ANA, C3, C4, were obtained  with negative results.  A 24-hour urine for protein revealed proteinuria of 1500 range.  It was felt that a combination of pyelonephritis, NSAIDs abuse, hypertension, prerenal azotemia and possible diabetes diagnosed during this hospital stay were the most likely etiologies.  With supportive, the patients renal function improved steadily. At time of transfer to the subacute unit, the patients creatinine was 1.8. Further follow up on this problem will be performed by our team in the subacute and then by the new primary care provided to be decided upon discharge from the subacute unit in [facility].  #2 - ESCHERICHIA COLI PYELONEPHRITIS WITH SEPTICEMIA:  Patient presented with symptoms of sepsis.  Also gross pyuria was present in the initial urinalysis. Blood cultures were obtained.  Tequin, renal dose adjusted, was started in the emergency department.  The blood cultures remained stable at the time of discharge.  The urine culture isolated E. coli that was sensitive to most of the antibiotics tested.  Tequin was continued due to the patients allergy to SULFA and PENICILLIN.  The patient improved significantly throughout this hospital stay from this problem standpoint.  By the time of transfer, the patient is on day #12 out of #14 of therapy for this pyelonephritis.  #3 - NEWLY DIAGNOSED UNCONTROLLED TYPE 2 DIABETES MELLITUS:  For the first three days of hospital stay, the serum glucose levels were normal.  After the third/fourth day of hospitalization, the patient developed significant hyperglycemia.  Her CBGs were in the 300-400s.  No dextrose nor prednisone was being used.  There was no evidence of other sources to explain the patients hyperglycemia except type 2 diabetes mellitus.  A hemoglobin A1C though showed a normal range at 6.2%.  Also of notice is the significant proteinuria noticed in the 24-hour urine collection.  This range of proteinuria does not reach  the  nephrotic range.  It is unclear if the proteinuria also could be due to the patients concurrent pyelonephritis.  This 24-hour urine for protein should be repeated upon discharge.  Regarding the diabetes therapy, the patient was placed on a sliding scale insulin.  Oral hypoglycemic agents also were added with a good response.  By the time of transfer, the patient CBGs were in the 170-200 range.  Further adjustments of the current medications is recommended. Given the patients hypertension and chronic renal insufficiency, an ACE inhibitor or ARB could be considered if the patients renal function were to continue improving.  #4 - PROBABLE HYPOTHYROIDISM:  TSH was obtained on admission obtaining a low value.  A Free T3 was consistent with hypothyroidism.  A thyroid scan was ordered today prior to transfer ready to be performed on May 13, 2000. Further therapy regarding this hypothyroidism will be decided once the data to be collected is available.  #5 - HYPOKALEMIA:  The patient is being currently helped with potassium supplementation.  No cardiac complications were noticed  despite this hypokalemia.  Further follow up on the patients potassium and magnesium level will be performed tomorrow at the subacute unit.  #6 - TRANSIENT COAGULOPATHY:  The patients INR on admission was 7.0.  The patient was taking Coumadin for about six years for a simple noncomplicated left DVT.  The patient received intravenous vitamin K with resolution of this coagulopathy.  No further anticoagulation therapy was indicated given the uncomplicated DVT in 1996.  No complications including acute bleeding were noticed throughout this hospital stay.  #7 - ABNORMAL LIVER FUNCTION TESTS ASSOCIATED WITH CHOLELITHIASIS:  On admission, the patients alkaline phosphatase was elevated at 330.  SGOT and SGPT were normal.  The Total bilirubin was 2.8.  The indirect bilirubin was 2.0.  The abdominal ultrasound showed  no signs of obstruction nor cholecystitis.  With supportive care, these abnormal liver function tests improved significantly and then normalized.  A gallbladder scan was normal. The patient also had a normal abdominal exam and she never had any problems with her diet.  #8 - HYPERTENSION:  This problem remained stable throughout this hospital stay.  #9 - PSYCHIATRY:  The patient was evaluated by Dr. Adelene Amas. Mills to assess her mental competency.  Patient was deemed to be full mentally competent.  MEDICAL CONDITION AT THE TIME OF DISCHARGE:  Improved. DD:  05/12/00 TD:  05/12/00 Job: [id number]

## 2010-10-23 NOTE — Discharge Summary (Signed)
[place]. [facility]  Patient:    [name], [name]                     MRN: [id number] Adm. Date:  [id number] Disc. Date: [id number] Attending:  Rosanne Sack CC:         Kern Reap, M.D.   Discharge Summary  DATE OF BIRTH:  09-26-1923  PRIMARY CARE PHYSICIAN:  Kern Reap, M.D.  DISCHARGE DIAGNOSES:  1. Recent Escherichia coli pyelonephritis with septicemia complicated with     acute renal failure with a 14-day course of antibiotic therapy.  2. Acute on chronic renal failure.     a. Multifactorial etiology--pyelonephritis, prerenal azotemia,        nonsteroidal antiinflammatory drug abuse, hypertension, diabetes.     b. Baseline creatinine 1.8 to 2.0.     c. A 1500 mg protein on 24-hour urine collection.     d. C3, C4, ANA, antibodies and urine eosinophils, negative.     e. Renal ultrasound negative except for a renal size with the right being        12.3 cm and 10.1.  3. Newly diagnosed type 2 diabetes mellitus with hemoglobin A1c 6.2.  4. Suspected de Quervains thyroiditis--followup recommended.  5. Anemia of chronic disease with baseline hemoglobin approximately 10.5.  6. Hypertension.  7. Known chololithiasis with negative HIDA scan and ultrasound of liver with     transient and normal liver function test.  8. Transient coagulopathy secondary to Coumadin toxicity--resolved.  9. History of deep vein thrombosis in 1995, on Coumadin until April 30, 2000. 10. Osteoarthritis. 11. History of dyspepsia. 12. History of bells palsy, January of 2000. 13. History of benign tumors x 2 of the left breast. 14. Allergies to PENICILLIN and SULFA drugs.  DISCHARGE MEDICATIONS:  1. Ferrous sulfate 325 mg p.o. t.i.d.  2. Norvasc 10 mg q.d.  3. Actos 45 mg q.d.  4. Lopressor 50 mg--one-half tablet q.d.  5. Amaryl 4 mg tablets one and a half tablets q.a.m. with food.  6. Nystatin 100,000 U/mL suspension--4 mL q.i.d. x 7 days, then  stop.  7. Ultram 50 mg one tablet p.o. q.6h. p.r.n. pain.  PROCEDURES:  Nuclear medicine thyroid imaging uptake scan on May 12, 2000--overall thyroid uptake is poor, but reveals possibly enlarged thyroid. This in associated with high T3 and low TSH suggest possibility of deQuervain thyroiditis.  A Hashimotos thyroiditis is also a possibility.  Graves disease is highly unlikely.  FOLLOW-UP:  The patient is to follow up with Dr. Kern Reap who will henceforth be her primary care physician.  She will see him on Friday, December 14 at 10 oclock in the a.m.  At that time it is recommended that a basic metabolic panel be obtained to assure the patients renal function is within normal limits.  Additionally, her potassium level should be reevaluation at that time.  Likewise, the patient should have recheck of thyroid function to include T3, T4, and TSH within the next four to six weeks. She should be followed for symptoms of hypo or hyperthyroidism.  HISTORY OF PRESENT ILLNESS:  [name] is a pleasant 77 year old Caucasian female, who is admitted to the subacute care unit on May 12, 2000, for reconditioning following a prolonged hospitalization due to pyelonephritis.  HOSPITAL COURSE: #1 - DECONDITIONING:  The patient underwent intensive inpatient therapy with physical therapy and occupational therapy and at the time of discharge was deemed to be  stable on her feet and appropriate for discharge home.  At the time of discharge, arrangements were made for home health RN, PT and OT to continue care for the patients needs at home.  #2 - ACUTE ON CHRONIC RENAL FAILURE:  During hospitalization in the SACU, the patients renal failure was stable with creatinine varying between 1.8 and 2.0.  It is recommended that this be followed up on a routine basis in the future to assess for possible further return of renal function and to catch any decline.  For full details, please see  discharge summary from recent acute care hospitalization.  #3 - NEWLY DIAGNOSED TYPE 2 DIABETES:  During hospitalization the patient was well-controlled on regimen listed above.  At the time of discharge, the patient was given prescriptions for these medications and assumption of care will be turned over to Dr. Barbee Shropshire for following of hemoglobin A1c and other diabetic issues.  #4 - POSSIBLE THYROIDITIS:  During hospitalization, the patient was found to have a TSH of 0.2 with a free T3 of 41 and a free T4 of 1.35.  A thyroid scan was ordered with details as listed above.  At the time of discharge, the patient was asymptomatic and no therapy was initiated.  Based upon laboratory as well as radiologic results it was felt that this likely represented a case of de Quervains thyroiditis.  It is recommended that this be followed up in the next four to six weeks and that the patient be watched closely for symptoms of hypo or hyperthyroidism.  #5 - HYPERTENSION:  The patient was discharged on antihypertensive regimen as listed above.  Blood pressure was well-controlled during her hospitalization and there were no acute problems during her stay in the subacute care unit related to this disease. DD:  05/18/00 TD:  05/19/00 Job: [id number]

## 2010-10-23 NOTE — Consult Note (Signed)
[name] [facility]  Patient:    [name], [name] Visit Number: [id number] MRN: [id number]          Service Type: MED Location: [id number] Attending Physician:  Rosanne Sack Admit Date:  04/30/2000   CC:         Rosanne Sack, M.D.   Consultation Report  REASON FOR CONSULTATION:  Renal failure.  HISTORY OF PRESENT ILLNESS:  This is a 77 year old female who presented on November 24, with approximately one week of weakness, fatigue, chills, anorexia who was found to have pyelonephritis and a creatinine of 6 on admission and subsequently fell to 5 and now it is back up to 5.6.  She has no known history of renal disease that she knows of, but we do not have old creatinines.  She has a history of degenerative joint disease of the back and knees, history of headaches, history of DVT in 1995.  History of Bells palsy.  On admission she had anemia, coagulopathy, and increased LFTs.  She was found to have some gallstones and her Coumadin was stopped and her coagulopathy is improved.  MEDICATIONS: 1. Coumadin. 2. Antiarthritic. 3. Pain medication. 4. Allergy medication.  FAMILY HISTORY:  No known family history of renal disease or hearing deficit, musculoskeletal defects or eye defects.  SOCIAL HISTORY:  The patient denies UTIs, voiding symptoms, stones, nocturia. No history of asthma, but is wheezing now.  No history of hypertension or diabetes.  ALLERGIES:  PENICILLIN, SULFA.  PAST MEDICAL HISTORY:  Listed above. She has the history of osteoarthritis, Bells palsy in January of 2000, history of chronic headaches, benign breast lumps with removal x 2, history of dyspepsia, history of DVT.  She also takes Pepcid.  FAMILY HISTORY:  A sister who died of complications of diabetes.  Apparently a brother with diabetes and another brother with lung cancer.  She had eight siblings and only has three alive.  She does not know why the  rest passed away. Her father died in his 52s of unknown cause.  Mother died in her 34s "of old age."  SOCIAL HISTORY:  She was born in [place].  She lives with her husband here in [place].  She is a nonsmoker.  Occasional alcohol consumption with meals. She is retired.  REVIEW OF SYSTEMS:  HEENT:  She has headaches, but now they are very infrequent.  She denies visual symptoms.  Denies hearing abnormalities. CARDIOVASCULAR: She is able to do her housework without shortness of breath. Denies any PND.  She sleeps on one pillow and denies nocturia.  She says she does have some ankle edema.  LUNGS: She is a nonsmoker, no cough, history of asthma.  She does have history of allergic rhinitis. GASTROINTESTINAL: She has upset stomach with indigestion intermittently.  No constipation or diarrhea. No blood in her stools.  No black, tarry stools or history of hepatitis or yellow jaundice.  SKIN: Unremarkable.  MUSCULOSKELETAL: Pain in her back and knees.  PHYSICAL EXAMINATION:  GENERAL:  Elderly female, very diffuse historian.  She is short of breath sitting there.  VITAL SIGNS:  Blood pressure supine 162/68, going to 158/62.  Heart rate goes from 84 to 90.  HEENT:  Reveals dry oral mucosa.  Fundi are unremarkable.  Ears are unremarkable.  NECK:  Shows no bruits and no thyromegaly.  HEART:  S4, regular rhythm, grade 2/6 systolic ejection murmur heard best at the upper outer sternal border at apex.  PMI is 12 cm from  the fifth intercostal space.  Hyperdynamic precordium.  Trace to 1+ edema.  Pulses 2+ over 4+.  No bruits are noted.  LUNGS:  Reveal diffuse wheezes, occasional rhonchi, no rales.  She has a respiratory rate of 24.  ABDOMEN:  Positive bowel sounds.  Soft, mild distention, no organomegaly. Liver is down 3 cm only.  SKIN:  Some bruises, no active lesions.  MUSCULOSKELETAL:  Some hypertrophic changes in her hands.  LABORATORY DATA:  Escherichia coli in her urine, blood  cultures are negative. Hemoglobin 10.5, white count 26,600 with a left shift, platelets 221,000, PT/INR has gone from 7.0 to 1.2, iron saturation 19%, ferritin 551. Urinalysis 100 mg% protein, 2150 red cells, too numerous to count white cells. Sodium 141, potassium 3.9, chloride 112, bicarbonate 16, creatinine 5.6, BUN 82, glucose 87, alkaline phosphatase 361, SGOT 25, SGPT 24.  Bilirubin has been up.  Ultrasound also showed some gallstones, one impacted in the apex of the gallbladder.  ASSESSMENT: 1. Renal failure, acute versus chronic, probably some chronic component, but    mostly acute related to acute tubular cyst from infection and    nonsteroidals.  Probably some volume depletion leading to nonsteroidal    toxicity.  Her GFR at this time is 10 cc/min.  She is anemic and some    of it is chronic disease, but she also is iron deficient and needs to    be evaluated.  She is mildly uremic at this time, acidemic, hypertensive,    and volume overloaded which has been contributing to her wheezing.    We need her old creatinines from Dr. Diamantina Providence office.  She does not    appear to have a consumptive coagulopathy or a marked renopathic process. 2. Hypertension.  We have used calcium channel blockers at this time.  We    need to decrease her fluid administration. 3. Wheezing.  Volume excess is contributing to this and she has some    bronchospasm. 4. Anemia.  She is iron deficient and needs to evaluate that status.  May    need Epo as well as iron.  PLAN:  Check her parathyroid hormone level.  24-hour urine.  Antibiotics. Discontinue IV fluids.  Get old creatinines.  Repeat UA.  Attending Physician: Rosanne Sack DD:  05/02/00 TD:  05/02/00 Job: [id number]

## 2010-10-23 NOTE — H&P (Signed)
[name], [name]              ACCOUNT NO.:  [id number]   MEDICAL RECORD NO.:  [id number]          PATIENT TYPE:  OIB   LOCATION:  5707                         FACILITY:  [facility]   PHYSICIAN:  Teena Irani. Arlyce Dice, M.D.  DATE OF BIRTH:  1923/11/10   DATE OF ADMISSION:  09/12/2006  DATE OF DISCHARGE:                              HISTORY & PHYSICAL   PRIMARY CARE PHYSICIAN:  Olene Craven, M.D.   REASON FOR ADMISSION:  Observation following ERCP and consultation  regarding laparoscopic cholecystectomy.   HISTORY OF PRESENT ILLNESS:  [name] is an 77 year old white  female who has a history of nausea, vomiting, some of this of partially  digested food products.  She has been diagnosed with GERD and takes  Nexium once daily.  She has a remote history of internal hemorrhoids on  a flexible sigmoidoscopy about 11 years previously by Dr. Sheryn Bison.  She was seen in his office on March 28, and he arranged for  an ultrasound study, and liver function and pancreatic enzyme tests.  She had some dysphagia, and he recommended a level-3 dysphagia diet and  increase her Nexium to twice daily.   Following the results of these above studies, Dr. Jarold Motto referred Ms.  Mills to Dr. Melvia Heaps because on the ultrasound study of April  2, she had multiple gallstones, a biliary duct measuring 14.7-mm which  is significantly dilated.  Although no stones were seen in the common  bile duct, there was a suspicion that there may be stones there causing  the dilatation.  Also noted was incidental fatty changes in the liver.  Liver tests were also elevated with an alkaline phosphatase of 221, AST  121, ALT of 211.   Dr. Arlyce Dice brought the patient to [facility] for an ERCP on  September 12, 2006.  Dr. Arlyce Dice performed a sphincterotomy and noted multiple  filling defects.  Multiple stones were extracted with the use of the  extraction basket.  She was admitted following the  procedure for post  ERCP observation as well as surgical evaluation for consideration of  laparoscopic cholecystectomy.   PAST MEDICAL HISTORY:  1. Diabetes which is diet controlled.  2. E-coli sepsis secondary to pyelonephritis in 2001.  3. Chronic renal failure.  4. De Quervain's thyroiditis.  5. History of a deep venous thrombosis, 1995.  She took Coumadin      through November 2001.  6. Anemia of chronic disease.  7. Bell's palsy in July 2000.  8. Benign breast mass, which has been biopsied twice in the left      breast.  9. Penicillin, sulfa allergies.  10.Osteoarthritis.  11.Congestive heart failure in 2002, question secondary to the      medication Actos.  Her EF in 2002, was normal by echocardiogram.  12.Seasonal allergies.  13.Status post C-section.   Her cardiologist is Dr. Elsie Lincoln.   MEDICATIONS:  1. Metoprolol 50 mg one p.o. b.i.d.  2. Zyrtec 10 mg once daily.  3. Meloxicam 15 mg once daily.  4. Nexium 40 mg twice daily.  5. Furosemide 20 mg once  daily  6. Caduet 05/10 mg once daily.   SOCIAL HISTORY:  The patient lives with her supportive family in  [place].  The contact person is Nadean Corwin, telephone number is  [phone].  Another phone contact is [phone].  The patient does  not smoke.  She occasionally has a glass of wine but quite infrequently.  The patient was born in [place].   FAMILY HISTORY:  No family history of gallbladder or liver disease or  biliary tract disease.  No family history of GI neoplasm.  One brother  had diabetes.  A second brother had lung cancer.  There is no history of  strokes, hypertension, or coronary artery disease.   REVIEW OF SYSTEMS:  CARDIOVASCULAR:  No chest pain, no palpitations.  The patient occasionally has rhinorrhea and sinus congestion from  seasonal allergies.  She does have some difficulty with her memory but  does not yet carry a diagnosis of dementia.  Denies excessive thirst,  has not used any oral  agents to control diabetes, does not check her  blood sugars, and has not been advised to do so, denies urinary  frequency, urgency or incontinence, does have epigastric discomfort.   DIET AT HOME:  Regular diet which is to say no sugar, fat, or salt  restrictions.   PHYSICAL EXAMINATION:  VITAL SIGNS:  Blood pressure 80/42, pulse is 50,  respirations 18, temperature 97.3, weight 72.7 kg.  She is 60 inches  tall.  GENERAL:  The patient is a slightly confused, elderly, white female.  She is not pale.  HEENT:  Extraocular movements intact.  No icterus, no conjunctival  pallor.  Oropharynx is moist and clear.  NECK:  No masses, no JVD.  CHEST:  Clear to auscultation and percussion bilaterally.  CARDIOVASCULAR:  The rhythm is regular with no murmurs, rubs, or gallops  appreciated.  GI:  There is no hepatosplenomegaly, masses, or tenderness.  Bowel  sounds active.  No bruits.  EXTREMITIES:  No cyanosis, clubbing, or edema present.  RECTAL/GU/BREASTS:  Deferred.  NEUROLOGIC:  The patient follows commands.  She is alert and oriented to  place and self, not clear about the date.  There is no tremor.  PSYCHIATRIC:  The patient is cooperative and appropriate.   LABORATORY:  Labs from March 28.  The sed rate 58.  Total bilirubin 2.1,  alkaline phosphatase 221, AST 121, ALT 211.  Lipase 29, amylase 57.  TSH  1.15.  B12 level 1335, folate level greater than 20.  Hemoglobin 15.6,  hematocrit 44.6.  White blood cell count 7.5, platelets 222.  MCV 89.3.  Sodium 142, potassium 4.5.  Glucose 78.  BUN 18, creatinine 1.7.  Chloride 106, CO2 29.   IMPRESSION:  1. Choledocholithiasis, cholelithiasis, status post ERCP with      sphincterotomy and extraction of multiple common bile duct stones.  2. History of  abdominal pain with some nausea secondary to above.  3. Transaminitis with mild increased total bilirubin secondary to      number one. 4. History of hypertension, not hypertensive currently.   5. Incidental fatty liver found on recent ultrasound.   PLAN:  1. The patient admitted for observation.  2. Have arranged for surgical evaluation, and the patient will      hopefully be able to undergo a laparoscopic cholecystectomy before      she is discharged from this hospitalization.  3. Will support with p.r.n. analgesics, antiemetics, as well as IV      fluids.  Jennye Moccasin, PA-C    ______________________________  Teena Irani. Arlyce Dice, M.D.    SG/MEDQ  D:  09/15/2006  T:  09/15/2006  Job:  [id number]   cc:   Olene Craven, M.D.

## 2010-10-23 NOTE — Op Note (Signed)
[name], [name]              ACCOUNT NO.:  [id number]   MEDICAL RECORD NO.:  [id number]          PATIENT TYPE:  OIB   LOCATION:  5707                         FACILITY:  [facility]   PHYSICIAN:  Gabrielle Dare. Janee Morn, M.D.DATE OF BIRTH:  06/28/1923   DATE OF PROCEDURE:  DATE OF DISCHARGE:                               OPERATIVE REPORT   PREOPERATIVE DIAGNOSES:  1. Cholelithiasis.  2. History of choledocholithiasis.   POSTOPERATIVE DIAGNOSES:  1. Cholelithiasis.  2. Choledocholithiasis.   PROCEDURE:  Laparoscopic cholecystectomy with intraoperative  cholangiogram.   SURGEON:  Violeta Gelinas, MD   ANESTHESIA:  General.   HISTORY OF PRESENT ILLNESS:  The patient is an 77 year old female who  was admitted by Dr. Melvia Heaps yesterday for ERCP.  She had known  choledocholithiasis.  She underwent successful ERCP with clearing of the  common bile duct and we are taking her today for cholecystectomy.   PROCEDURE IN DETAIL:  Informed consent was obtained.  The patient is  receiving intravenous antibiotics.  She was brought to the operating  room.  General anesthesia was administered.  Her abdomen was prepped and  draped in a sterile fashion.  The area just superior to her umbilicus  was infiltrated with 0.25% Marcaine with epinephrine.  A supraumbilical  incision was made and subcutaneous tissues were dissected down revealing  the anterior fascia; this was divided sharply.  Peritoneal cavity was  entered under direct vision without difficulty.  A 0 Vicryl pursestring  suture was placed around the fascial opening and the Hasson trocar was  inserted into the abdomen.  The abdomen was insufflated with carbon  dioxide in sterile fashion under direct vision, then an 11-mm epigastric  and two 5-mm lateral ports were placed; 0.25% Marcaine with epinephrine  was used at all port sites.  Laparoscopic exploration revealed an  inflamed gallbladder with a lot of omental adhesions; these were  gradually swept down, exposing the dome.  The dome was retracted  superomedially.  Further gentle blunt dissection was used to sweep the  rest of these omental adhesions away, revealing the body of the  gallbladder.  The stomach and duodenum were also swept away very gently  in a gradual fashion.  Eventually, the infundibulum was revealed; this  was retracted inferolaterally.  Further dissection was done beginning  laterally and progressing medially; this easily identified the cystic  duct as well as cystic artery.  Both were circumferentially dissected  and the cystic duct dissection continued until a large window was  created between the cystic duct, the infundibulum and the liver.  Once  we achieved excellent visualization, a clip was placed on the  infundibulum-cystic duct junction.  A small nick was made in the cystic  duct.  A Reddick cholangiogram catheter was inserted and intraoperative  cholangiogram was obtained; this demonstrated good length of cystic  duct, a dilated common bile duct and some common bile duct filling  defects down distally, consistent with stones.  Some flushing of the  contrast through was able to flush at least some of these defects down  into the intestine, as they passed  easily.  The cholangiogram was  terminated.  The cholangiogram catheter was removed.  Two clips were  placed proximally on the cystic duct with good occlusion and the cystic  duct was divided.  The cystic artery was clipped twice proximally and  once distally and divided.  The gallbladder was gradually taken off the  liver bed with Bovie cautery, getting excellent hemostasis along the  way.  The gallbladder was placed in an EndoCatch bag and removed from  the abdomen via the supraumbilical port site.  The liver bed was  copiously irrigated.  Bovie cautery was used to carefully get excellent  hemostasis.  The area was copiously irrigated.  Clips remained in good  position and the liver bed  was dry after cauterization.  A half a piece  of Surgicel was placed in the liver bed.  The abdomen had been copiously  irrigated throughout our dissection and hemostasis efforts. The  irrigation fluid was evacuated and returned clear.  The liver bed with  Surgicel was checked and was dry.  The remainder of the irrigation fluid  was evacuated.  The ports were then removed under direct vision.  The  pneumoperitoneum was released.  The Hasson trocar was removed.  The  supraumbilical fascia was closed by tying the 0 Vicryl pursestring  suture.  All 4 wounds were copiously irrigated and the skin of each was  closed with running 4-0 Vicryl subcuticular stitch.  Sponge, needle and  instrument counts were correct.  Benzoin, Steri-Strips and sterile  dressings were applied.  The patient was taken to the recovery room in  stable condition after tolerating procedure without apparent  complication.  I discussed the findings of her cholangiogram with  Radiology and they agreed.  I also spoke with Dr. Wendall Papa, who is Dr.  Marzetta Board partner, in regards to plans regarding these common bile duct  stones.  We feel they should likely pass and we will recheck some liver  function test in the morning.      Gabrielle Dare Janee Morn, M.D.  Electronically Signed     BET/MEDQ  D:  09/13/2006  T:  09/13/2006  Job:  [id number]   cc:   Barbette Hair. Arlyce Dice, MD,FACG

## 2010-10-23 NOTE — Assessment & Plan Note (Signed)
[facility]                         GASTROENTEROLOGY OFFICE NOTE   [name], [name]                     MRN:          [id number]  DATE:09/08/2006                            DOB:          02/06/1924    PROBLEM:  Choledocholithiasis.  [name] is a pleasant 77 year old  white female seen at the request of Dr. Jarold Motto for possible bile duct  stone.  She was evaluated last week for abdominal pain.  She  subsequently underwent liver test and ultrasound.  The liver tests were  pertinent for an alkaline phosphatase of 221, AST 121, and ALT of 211.  Abdominal ultrasound demonstrated multiple gallbladder stones and bile  duct dilatation measuring 14.7 mm suggestive of a common bile duct  stone, and mild fatty infiltration of the liver.  [name]  continues to have low grade abdominal pain.  She is without fever or  chills.   EXAM:  Pulse 56, blood pressure 140/82, weight 176.   IMPRESSION:  1. Choledocholithiasis.  2. Chronic cholecystitis with cholelithiasis.   RECOMMENDATIONS:  1. ERCP with sphincterotomy and stone removal.  2. Cholecystectomy.     Barbette Hair. Arlyce Dice, MD,FACG  Electronically Signed    RDK/MedQ  DD: 09/08/2006  DT: 09/08/2006  Job #: [id number]   cc:   Olene Craven, M.D.

## 2010-10-23 NOTE — Assessment & Plan Note (Signed)
[facility]                         GASTROENTEROLOGY OFFICE NOTE   [name], [name]                     MRN:          [id number]  DATE:09/02/2006                            DOB:          10-23-1923    Ms. Cubero is an 77 year old white female with chronic acid reflux on  Nexium who was brought in by her daughter today for evaluation of  epigastric pain with nausea and vomiting of partially digested food  products.  She denies lower GI complaints.  I have only seen this  patient approximately 11 years ago for flexible sigmoidoscopy, at which  time she had some internal hemorrhoids.  She is followed by Dr. Barbee Shropshire  for hypertensive cardiovascular disease and is on metoprolol 50 mg twice  a day, Lasix 20 mg a day, Caduet 5-10 mg a day, Mobic 15 mg a day, and  daily Zyrtec.  SHE DENIES DRUG ALLERGIES.   Despite the above complaints, she has had no anorexia, weight loss,  melena or hematochezia.  She denies abuse of other NSAIDs, use of  alcohol or cigarettes.  She denies any specific food intolerances.   FAMILY HISTORY:  Noncontributory.   EXAM TODAY:  Shows her to be an elderly white female who is somewhat  confused.  Cannot appreciate stigmata of chronic liver disease.  She is  5 feet 5 inches tall and weighs 176 pounds.  Blood pressure was 140/80  and pulse was 84 and regular.  I could not appreciate stigmata of  chronic liver disease.  CHEST:  Clear and she appeared to be in a regular rhythm without  murmurs, gallops or rubs.  I could not appreciate hepatosplenomegaly,  abdominal masses or tenderness.  Bowel sounds were normal.  EXTREMITIES:  Unremarkable.  RECTAL:  Deferred.   ASSESSMENT:  Ms. Ainley points to her subxiphoid area where her pain  hurts, and I think she probably has hiatal hernia and peptic stricture  of her esophagus.  Other considerations would be atypical presentation  of cholelithiasis.   RECOMMENDATIONS:  1.  Outpatient ultrasound and endoscopy.  2. Probable therapeutic esophageal dilatation.  3. Check CBC, liver function test, amylase, lipase and metabolic      profile.  4. Level 3 dysphagia diet.  5. Increase Nexium to 40 mg twice a day.  6. Other medications as per Dr. Barbee Shropshire.     Vania Rea. Jarold Motto, MD, Caleen Essex, FAGA  Electronically Signed    DRP/MedQ  DD: 09/02/2006  DT: 09/02/2006  Job #: [id number]   cc:   Olene Craven, M.D.

## 2010-10-23 NOTE — H&P (Signed)
[name]  Patient:    [name], [name]                     MRN: [id number] Adm. Date:  [id number] Attending:  Wilson Singer CC:         Kern Reap, M.D.   History and Physical  HISTORY OF PRESENT ILLNESS:  This 77 year old lady was discharged two days ago having been admitted with congestive heart failure of sudden onset.  Cardiac workup did not actually reveal any significant abnormalities and showed an ejection fraction of 55-65% and was negative for ischemia.  An echocardiogram also did not show any abnormalities including no abnormalities for wall motion.  She now presents with hypothermia, hypoglycemia, bradycardia, and hypotension in the emergency room with a confused state.  Apparently, according to the husband, she was well this morning.  She apparently has not been eating food normally.  MEDICATIONS: 1. Amaryl 10 mg q.d. 2. Lopressor 25 mg q.d. 3. Prevacid 30 mg q.d. 4. Clonidine 0.1 mg b.i.d. 5. Norvasc 10 mg q.d. 6. Aspirin 1 tablet q.d. 7. Vioxx 12.5 mg q.d. p.r.n. 8. Lipitor 10 mg q.h.s.  ALLERGIES:  SULFA and PENICILLIN.  SOCIAL HISTORY:  She is married and lives with her husband.  Unable to give me further history.  FAMILY HISTORY:  Noncontributory.  REVIEW OF SYSTEMS:  Unable to give me clearly at this stage.  PHYSICAL EXAMINATION:  VITAL SIGNS:  She had a rectal temperature of 93.1, now blood pressure is 170/90, although EMS found her blood pressure to be 92 systolic/palpable. Pulse is now 50 and in sinus rhythm.  GENERAL:  She is alert and oriented.  CARDIOVASCULAR:  Heart sounds are present and normal with no murmurs.  LUNGS:  Clear.  ABDOMEN:  Soft.  Does not have any evidence of abdominal aortic aneurysm.  NEUROLOGICAL:  She is alert and oriented now and seems to be moving all her limbs.  There are no obvious focal neurologic signs.  LABORATORY DATA:  Sodium 134, potassium 4.6, chloride 105,  BUN 88, creatinine 5.0, bicarbonate 15, pH 7.321.  Hemoglobin 13.7.  White blood cell count 8.0, platelets 195,000.  IMPRESSION: 1. Hypothermia possibly secondary to sepsis:  We will culture the urine and    blood and start her on empiric antibiotics. 2. Renal failure:  The cause of this is not entirely clear, although    parahepatic neuropathy is, I am sure, contributing to this.  In view of her    sudden episode of congestive heart failure on her last admission, I wonder    whether she has renal artery stenosis. 3. Hypoglycemia:  We will hold off on oral hypoglycemics for the present time    and watch her with a sliding scale insulin. 4. Bradycardia:  We will hold off on beta blocker and watch her blood pressure    with the other medications for the time being.  Further recommendations    will depend on progress and she will be admitted to the care of Dr. Kern Reap. DD:  09/02/00 TD:  09/03/00 Job: [id number]

## 2010-10-23 NOTE — Consult Note (Signed)
NAMESUNSHYNE, [name]              ACCOUNT NO.:  [id number]   MEDICAL RECORD NO.:  [id number]          PATIENT TYPE:  OIB   LOCATION:  2854                         FACILITY:  [facility]   PHYSICIAN:  Gabrielle Dare. Janee Morn, M.D.DATE OF BIRTH:  06-Sep-1923   DATE OF CONSULTATION:  09/12/2006  DATE OF DISCHARGE:                                 CONSULTATION   CHIEF COMPLAINT:  Choledocholithiasis.   HISTORY OF PRESENT ILLNESS:  We are asked to evaluate [name] for  plans for cholecystectomy.  She is undergoing an ERCP today by Dr.  Arlyce Dice for choledocholithiasis. She has had epigastric pain with nausea.  Workup demonstrated elevated liver function tests.  She obtained an  ultrasound of the abdomen which showed gallstones and common bile duct  dilatation up to 14.7 mm. She is in the endoscopy suite and set to  undergo ERCP by Dr. Arlyce Dice today.  She has no other complaints aside  from above. Her daughter is here and assists significantly with the  history.   PAST MEDICAL HISTORY:  1. Hypertension.  2. Osteoarthritis.  3. Chronic renal insufficiency.   PAST SURGICAL HISTORY:  1. C-section x2.  2. Two breast biopsies which were reportedly benign.   SOCIAL HISTORY:  Negative.   ALLERGIES:  No known drug allergies.   CURRENT MEDICATIONS:  Metoprolol, Zyrtec, Mobic, Nexium, Lasix and  Cardura.   REVIEW OF SYSTEMS:  Is positive for the GI symptoms of listed above,  otherwise negative, and she is a somewhat limited historian.   PHYSICAL EXAMINATION:  VITAL SIGNS:  Temperature 97.1, blood pressure  189/68, heart rate 58, respirations 16.  GENERAL:  She is awake, alert, and in no acute distress.  NEUROLOGIC:  She is following commands without delay.  HEENT:  Oral mucosa is moist.  Pupils are equal.  Sclera has no  significant icterus.  LUNGS:  Clear to auscultation bilaterally.  CARDIOVASCULAR:  Heart is regular.  Impulse is palpable in the left  chest.  No murmurs heard.  ABDOMEN:   Soft.  She has no significant tenderness.  Bowel sounds are  present.  She has a lower midline scar from her C-section.  EXTREMITIES:  Warm with minimal edema.   LABORATORY STUDIES:  White blood cell count 7.5, hemoglobin 15.6,  platelets 222.  Sodium 142, potassium 4.5, chloride 104, CO2 29, BUN 18,  creatinine 1.7, glucose 79. Lipase 29, amylase 57. Total bilirubin 2.1,  alkaline phosphatase 221, AST 121, ALT 291.   Abdominal ultrasound shows multiple gallstones and common bile duct  dilatation up to 14.7 mm.   IMPRESSION:  1. Symptomatic cholelithiasis, possible chronic cholecystitis.  2. Likely choledocholithiasis.  3. Hypertension.   PLAN:  The patient is undergoing ERCP today by Dr. Arlyce Dice. She is  receiving Cipro intravenously.  We will plan laparoscopic  cholecystectomy with cholangiogram in the morning provided her procedure  today goes well.  Procedure, risks, and benefits discussed in detail  with the patient and her daughter.  Questions were answered.  We also  discussed the possibility of conversion to open procedure if necessary.  We will plan to  proceed in the morning.      Gabrielle Dare Janee Morn, M.D.  Electronically Signed     BET/MEDQ  D:  09/12/2006  T:  09/12/2006  Job:  [id number]   cc:   Barbette Hair. Arlyce Dice, MD,FACG

## 2010-10-23 NOTE — Assessment & Plan Note (Signed)
[facility]                         GASTROENTEROLOGY OFFICE NOTE   RUIE, SENDEJO                     MRN:          [id number]  DATE:09/02/2006                            DOB:          November 20, 1923    Jaloni is brought in by her daughter because of intermittent nausea and  vomiting which sounds like partially digested food products.  She has  had chronic acid reflux.    INCOMPLETE     Vania Rea. Jarold Motto, MD, Caleen Essex, FAGA  Electronically Signed    DRP/MedQ  DD: 09/02/2006  DT: 09/02/2006  Job #: [id number]

## 2010-10-23 NOTE — H&P (Signed)
[facility]  Patient:    [name], [name]                     MRN: [id number] Adm. Date:  [id number] Attending:  Rosanne Sack CC:         Juluis Mire, M.D.   History and Physical  DATE OF BIRTH:  August 17, 1933  PROBLEM LIST:  1. Acute pyelonephritis with septicemia, rule out bacteremia.  2. Acute renal failure, BUN 82, creatinine 6.0.     a. Unknown renal function.  3. Coagulopathy secondary to Coumadin toxicity, rule out DIC, TTP, HUS.  4. Severe hydration and orthostatic hypotension.  5. Hyperbilirubinemia.     a. Cholelithiasis by ultrasound.     b. Normal LFTs.  6. Normocytic anemia, hemoglobin 11.3, MCV 88.     a. Hemoglobin 15.6 in February 2000.  7. Anion gap metabolic acidosis, ? RTA.  8. 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  9. History of Bells palsy in January 2000. 10. Osteoarthritis. 11. History of dyspepsia. 12. Status post left breast benign lump removal x 2. 13. Allergy to PENICILLIN, SULFA DRUGS (skin rash).  CHIEF COMPLAINT:  Dry mouth, fever, chills, generalized weakness.  HISTORY OF PRESENT ILLNESS:  [name] is a 77 year old female who presents with a seven day history of dysuria, polyuria, left flank pain, malaise, subjective fever, chills, decreased appetite and light-headedness standing up.  The patient denies nausea, vomiting, diarrhea, constipation.  No melena, no tarry stools, no bright red blood per rectum, no night sweats, no weight loss, no chest pain, no shortness of breath, no cough, no focal weakness, no swallowing problems.  The patient describes severe dry mouth. She is unable to pronounce correctly due to dry mouth.  No syncope.  She describes light-headedness standing up.  She describes chronic low back pain, no abdominal cramping. No renalria.  No lower extremity swelling.  No orthopnea.  No anginal symptoms.  PAST MEDICAL HISTORY:  As problem list.  ALLERGIES:   PENICILLIN, SULFA DRUGS (skin rash).  MEDICATIONS: 1. Coumadin, unknown dose since 1995. 2. Arthritis pill. 3. NSAID three times a day. 4. Pepcid 20 mg p.o. b.i.d.  FAMILY MEDICAL HISTORY:  Her brother had diabetes.  Second brother had lung cancer.  No hypertension, strokes or coronary artery disease in the family.  SOCIAL HISTORY:  The patient was born in [place].  She has two grown daughters, nonsmoking.  She drinks occasionally with meals.  The patient is retired.  REVIEW OF SYSTEMS:  As in History of Present Illness.  PHYSICAL EXAMINATION:  VITAL SIGNS:  Temperature 98.6, blood pressure 152/70, systolic blood pressure drops by 40 points standing up.  Heart rate 87, respirations 20, oxygen saturations 95% on room air.  HEENT:  Normocephalic, atraumatic.  Nonicteric sclerae.  Conjunctivae within normal limits.  PERRLA.  EOMI.  Funduscopic exam with no papilledema or hemorrhages.  Very dry mucous membranes.  Oropharynx clear.  NECK:  Supple, no JVD, no bruits, no thyromegaly.  LUNGS:  Clear to auscultation bilaterally without crackles, no wheezes Good air movement bilaterally.  CARDIAC:  Regular rate and rhythm with 1/6 systolic ejection murmur at the left lower sternal border on apex.  No S3, S4, no rubs.  ABDOMEN:  Obese, overall diffuse tenderness without focality, no rebound.  No guarding.  Bowel sounds were present.  No hepatosplenomegaly.  No Murphys sign.  No bruits, no masses.  BREAST EXAM:  Within normal  limits.  RECTAL EXAM:  Empty vault, normal sphincter tone.  EXTREMITIES:  No clubbing, cyanosis, or edema.  Pulses 2+ bilaterally.  NEUROLOGIC EXAM:  Alert and oriented x 3.  Slight slurred speech.  Cranial nerves 2-12 intact.  Strength 5/5 in all extremities.  Sensorium intact. Plantar reflexes downgoing bilaterally.  LABORATORY DATA:  Sodium 138, potassium 4.0, chloride 109, CO2 18, glucose 110, BUN 82, creatinine, 6.0, calcium 8.5, total protein 6.4,  albumin 2.2, SGOT 26, SGPT 27, alkaline phosphatase 330, total bilirubin 2.8.  Hemoglobin 11.3, MCV 88, WBC 26.1, platelets 212, absolute neutrophil count 22.2, toxic granulation seen in the WBC morphology.  INR 7.0, PTT 113.  Urinalysis: WBCs too numerous to count, rbcs 21-50, wbc clumps noted, no ketones, no glucose in urine, specific gravity 1.015.  The rest of the lab data pending.  ASSESSMENT AND PLAN: 1. Acute pyelonephritis with septicemia, rule out bacteriemia:  Given the patients urinary symptoms associated with gross pyuria and left flank pain, pyelonephritis is very likely.  Given the abnormal alkaline phosphatase associated with gallbladder stones by ultrasound, cholelithiasis is possible. This ultrasound showed no signs of obstruction nor cholecystitis.  For this reason, at this point, it seems like the source of infection seems to be pyelonephritis.  Blood cultures have been obtained in the emergency department.  A urine culture is pending.  Will go ahead and start the patient on intravenous Tequin, renal dose adjusted.  Supportive carer with IV fluids also will be provided.  Currently, the patient is hemodynamically stable.  No evidence of frank sepsis noticed at this point.  Will continue to monitor the patients abdominal exam to look for signs of possible cholecystitis. Currently, given the patients coagulopathy, an acute surgical intervention would be out of the question if cholecystitis were to be the cause of the patients symptoms.  The renal ultrasound showed no signs of abscess formation.  2. Acute renal failure:  The most likely etiology seems to be prerenal azotemia, given the degree of hydration noticed on physical examination. Also interstitial nephritis, due to NSAID use is possible.  We do not have any baseline renal function prior to this admission.  The urinalysis showed no casts that could be seen in cases of glomerulonephritis.  No evidence of obstructive  uropathy noticed by ultrasound.  For now will provide with  supportive care on IV hydration.  The renal function will be repeated tomorrow morning.  A urine for eosinophils will be obtained.  Further workup will be decided based on the response to the current therapy.  3. Coagulopathy:  Currently, there is no sign of active bleeding, despite increased PT/PTT.  At this point, I believe the combination of a degree of sepsis, acute renal failure and Coumadin toxicity are the most likely etiologies for this coagulopathy.  Platelet count seems to be stable.  DIC is possible, though less likely.  No clinical signs of TTP nor HUS are noticed at this point.  Will go ahead and obtain an rbc smear to rule out schistocytes. The patient is receiving vitamin K intravenously tonight.  A new set of coags will be obtained tomorrow morning.  The patient was placed on Coumadin around 1995 after a lower extremity DVT without complications.  Currently the standard of care recommends about six months of anticoagulation therapy.  For this reason, we will stop this treatment at this point.  4. Severe dehydration, associated with orthostatic hypotension:  As described in the physical examination section, the patient has orthostatic hypotension.  The physical examination shows a severe degree of dehydration. Will start IV fluids intravenously, will be cautious with rate of hydration, due to patients advanced age.  No previous cardiac history is reported by Ms. Pribble.  Will be monitoring the patients fluid balance, daily weights and orthostatic blood pressure everyday during this hospital stay.  5. Hyperbilirubinemia:  The SGOT and SGPT are within normal limits.  The alkaline phosphatase is elevated.  The ultrasound of the abdomen showed cholelithiasis.  A stone was also localized in the neck of the gallbladder without evidence of obstruction.  Also, this ultrasound showed no evidence of cholecystitis.  The  abdominal exam is benign.  The nature of the elevated bilirubin could be associated with a mild degree of hemolysis and sepsis. as said above.  An rbc smear is being obtained to rule out schistocytes.  Will be monitoring the patients abdominal exam and LFTs.  If any suspicion of acute cholecystitis were to be noticed, a surgical consult will be obtained.  6. Normocytic anemia:  No acute signs of GI bleed noticed.  Will guaiac the stools.  The patient will be placed on Protonix twice a day.  The patients hemoglobin will be obtained tomorrow morning. DD:  04/30/00 TD:  05/01/00 Job: [id number]

## 2014-04-27 ENCOUNTER — Encounter (HOSPITAL_COMMUNITY): Payer: Self-pay | Admitting: Emergency Medicine

## 2014-04-27 ENCOUNTER — Emergency Department (HOSPITAL_COMMUNITY)
Admission: EM | Admit: 2014-04-27 | Discharge: 2014-04-27 | Disposition: A | Discharge status: Home / Self Care | Payer: Medicare Other | Attending: Emergency Medicine | Admitting: Emergency Medicine

## 2014-04-27 ENCOUNTER — Emergency Department (HOSPITAL_COMMUNITY): Payer: Medicare Other

## 2014-04-27 DIAGNOSIS — I10 Essential (primary) hypertension: Secondary | ICD-10-CM | POA: Insufficient documentation

## 2014-04-27 DIAGNOSIS — Y9389 Activity, other specified: Secondary | ICD-10-CM | POA: Insufficient documentation

## 2014-04-27 DIAGNOSIS — Y929 Unspecified place or not applicable: Secondary | ICD-10-CM | POA: Diagnosis not present

## 2014-04-27 DIAGNOSIS — W06XXXA Fall from bed, initial encounter: Secondary | ICD-10-CM | POA: Insufficient documentation

## 2014-04-27 DIAGNOSIS — Y998 Other external cause status: Secondary | ICD-10-CM | POA: Diagnosis not present

## 2014-04-27 DIAGNOSIS — Z79899 Other long term (current) drug therapy: Secondary | ICD-10-CM | POA: Insufficient documentation

## 2014-04-27 DIAGNOSIS — S79911A Unspecified injury of right hip, initial encounter: Secondary | ICD-10-CM | POA: Diagnosis not present

## 2014-04-27 DIAGNOSIS — R52 Pain, unspecified: Secondary | ICD-10-CM

## 2014-04-27 DIAGNOSIS — S0191XA Laceration without foreign body of unspecified part of head, initial encounter: Secondary | ICD-10-CM

## 2014-04-27 DIAGNOSIS — S0990XA Unspecified injury of head, initial encounter: Secondary | ICD-10-CM | POA: Diagnosis present

## 2014-04-27 HISTORY — DX: Essential (primary) hypertension: I10

## 2014-04-27 LAB — CBC WITH DIFFERENTIAL/PLATELET
BASOS ABS: 0.1 10*3/uL (ref 0.0–0.1)
BASOS PCT: 1 % (ref 0–1)
Eosinophils Absolute: 0.4 10*3/uL (ref 0.0–0.7)
Eosinophils Relative: 4 % (ref 0–5)
HEMATOCRIT: 38.4 % (ref 36.0–46.0)
Hemoglobin: 12.7 g/dL (ref 12.0–15.0)
Lymphocytes Relative: 16 % (ref 12–46)
Lymphs Abs: 1.6 10*3/uL (ref 0.7–4.0)
MCH: 29.5 pg (ref 26.0–34.0)
MCHC: 33.1 g/dL (ref 30.0–36.0)
MCV: 89.1 fL (ref 78.0–100.0)
MONO ABS: 0.9 10*3/uL (ref 0.1–1.0)
Monocytes Relative: 8 % (ref 3–12)
NEUTROS ABS: 7.2 10*3/uL (ref 1.7–7.7)
Neutrophils Relative %: 71 % (ref 43–77)
Platelets: 271 10*3/uL (ref 150–400)
RBC: 4.31 MIL/uL (ref 3.87–5.11)
RDW: 13.3 % (ref 11.5–15.5)
WBC: 10.1 10*3/uL (ref 4.0–10.5)

## 2014-04-27 LAB — TYPE AND SCREEN
ABO/RH(D): B POS
Antibody Screen: NEGATIVE

## 2014-04-27 LAB — ABO/RH: ABO/RH(D): B POS

## 2014-04-27 LAB — BASIC METABOLIC PANEL
Anion gap: 16 — ABNORMAL HIGH (ref 5–15)
BUN: 35 mg/dL — AB (ref 6–23)
CALCIUM: 8.8 mg/dL (ref 8.4–10.5)
CO2: 24 mEq/L (ref 19–32)
CREATININE: 1.78 mg/dL — AB (ref 0.50–1.10)
Chloride: 103 mEq/L (ref 96–112)
GFR calc non Af Amer: 24 mL/min — ABNORMAL LOW (ref >90)
GFR, EST AFRICAN AMERICAN: 28 mL/min — AB (ref >90)
Glucose, Bld: 157 mg/dL — ABNORMAL HIGH (ref 70–99)
Potassium: 3.5 mEq/L — ABNORMAL LOW (ref 3.7–5.3)
Sodium: 143 mEq/L (ref 137–147)

## 2014-04-27 LAB — PROTIME-INR
INR: 1.11 (ref 0.00–1.49)
Prothrombin Time: 14.4 seconds (ref 11.6–15.2)

## 2014-04-27 MED ORDER — LIDOCAINE-EPINEPHRINE-TETRACAINE (LET) SOLUTION: 3.0000 mL | Freq: Once | NASAL | Status: DC

## 2014-04-27 MED ORDER — ONDANSETRON 4 MG PO TBDP: 4.0000 mg | ORAL_TABLET | Freq: Once | ORAL | Status: DC

## 2014-04-27 MED ORDER — OXYCODONE-ACETAMINOPHEN 5-325 MG PO TABS: 2.0000 | ORAL_TABLET | Freq: Once | ORAL | Status: DC

## 2014-04-27 MED ORDER — FENTANYL CITRATE 0.05 MG/ML IJ SOLN
50.0000 ug | INTRAMUSCULAR | Status: DC | PRN
Start: 2014-04-27 — End: 2014-04-27
  Administered 2014-04-27: 50 ug via INTRAVENOUS
  Filled 2014-04-27: qty 2

## 2014-04-27 MED ORDER — HYDROCODONE-ACETAMINOPHEN 5-325 MG PO TABS: 1.0000 | ORAL_TABLET | Freq: Four times a day (QID) | ORAL | Status: DC | PRN

## 2014-04-27 MED ORDER — AMOXICILLIN-POT CLAVULANATE 875-125 MG PO TABS: 1.0000 | ORAL_TABLET | Freq: Once | ORAL | Status: DC

## 2014-04-27 NOTE — ED Provider Notes (Signed)
CSN: [id number]     Arrival date & time 04/27/14  1451 History   First MD Initiated Contact with Patient 04/27/14 1507     Chief Complaint  Patient presents with  . Fall  . Head Laceration     (Consider location/radiation/quality/duration/timing/severity/associated sxs/prior Treatment) HPI    Patient to the ER with complaints of fall at [facility] facility. She was in her bed when she rolled out and fell from an 18 inch height. She was found in a significant amount of blood. The patient is at baseline mental status per facility. The patient also complains of right hip pain. She is no longer bleeding. She is awake and her vital signs are stable.  I spoke with the facility at [facility]-  Per facility patient is wheel chair bound and non walking. The staff member is only able to say that she has dementia and fell out of bed.  Past Medical History  Diagnosis Date  . Hypertension    History reviewed. No pertinent past surgical history. No family history on file. History  Substance Use Topics  . Smoking status: Never Smoker   . Smokeless tobacco: Not on file  . Alcohol Use: No   OB History    No data available     Review of Systems  10 Systems reviewed and are negative for acute change except as noted in the HPI.    Allergies  Review of patient's allergies indicates no known allergies.  Home Medications   Prior to Admission medications   Medication Sig Start Date End Date Taking? Authorizing Provider  amLODipine (NORVASC) 10 MG tablet Take 10 mg by mouth daily. 04/02/14  Yes Historical Provider, MD  cetirizine (ZYRTEC) 10 MG tablet Take 10 mg by mouth daily.   Yes Historical Provider, MD  furosemide (LASIX) 40 MG tablet Take 40 mg by mouth daily. 04/02/14  Yes Historical Provider, MD  metoprolol (LOPRESSOR) 50 MG tablet Take 50 mg by mouth 2 (two) times daily. 03/17/14  Yes Historical Provider, MD  Multiple Vitamins-Minerals (CENTRUM PO) Take 1 tablet by  mouth daily.   Yes Historical Provider, MD  omeprazole (PRILOSEC) 20 MG capsule Take 20 mg by mouth daily. 04/02/14  Yes Historical Provider, MD   BP 118/41 mmHg  Pulse 57  Temp(Src) 97.5 F (36.4 C) (Oral)  Resp 17  SpO2 93% Physical Exam  Constitutional: She appears well-developed and well-nourished. No distress.  HENT:  Head: Normocephalic. Head is with abrasion, with contusion and with laceration. Head is without raccoon's eyes, without Battle's sign, without right periorbital erythema and without left periorbital erythema. Hair is normal.    Right Ear: Tympanic membrane and ear canal normal.  Left Ear: Tympanic membrane and ear canal normal.  Nose: Nose normal.  Mouth/Throat: Uvula is midline and oropharynx is clear and moist.  Eyes: Pupils are equal, round, and reactive to light.  Neck: Normal range of motion. Neck supple. No spinous process tenderness and no muscular tenderness present. Normal range of motion present.  Cardiovascular: Normal rate and regular rhythm.   Pulmonary/Chest: Effort normal and breath sounds normal. She has no decreased breath sounds. She has no wheezes. She has no rhonchi. She exhibits no tenderness, no bony tenderness, no laceration, no crepitus and no deformity.  Abdominal: Soft. Bowel sounds are normal. There is no tenderness. There is no rigidity, no rebound, no guarding and no CVA tenderness.  Musculoskeletal:       Right shoulder: Normal.  Left shoulder: Normal.       Right elbow: Normal.      Left elbow: Normal.       Right wrist: Normal.       Left wrist: Normal.       Right hip: She exhibits decreased range of motion, tenderness and bony tenderness. She exhibits no deformity and no laceration.       Left hip: Normal.       Right knee: Normal.       Left knee: Normal.       Right ankle: Normal.       Left ankle: Normal.  Neurological: She is alert.  Pt follows simple commands  Skin: Skin is warm and dry.  Nursing note and vitals  reviewed.     ED Course  Procedures (including critical care time) Labs Review Labs Reviewed  BASIC METABOLIC PANEL - Abnormal; Notable for the following:    Potassium 3.5 (*)    Glucose, Bld 157 (*)    BUN 35 (*)    Creatinine, Ser 1.78 (*)    GFR calc non Af Amer 24 (*)    GFR calc Af Amer 28 (*)    Anion gap 16 (*)    All other components within normal limits  CBC WITH DIFFERENTIAL  PROTIME-INR  TYPE AND SCREEN  ABO/RH    Imaging Review Dg Chest 1 View  04/27/2014   CLINICAL DATA:  Pt fell out of bed that was approximately 18inches off the ground.  EXAM: CHEST - 1 VIEW  COMPARISON:  None.  FINDINGS: Mild enlarged cardiac silhouette. Chronic bronchitic markings. No effusion, infiltrate, pneumothorax. Degenerate changes of the shoulders.  IMPRESSION: No acute cardiopulmonary process.  Chronic bronchitic markings.   Electronically Signed   By: Genevive Bi M.D.   On: 04/27/2014 17:07   Dg Pelvis 1-2 Views  04/27/2014   EXAM: PELVIS - 1-2 VIEW  COMPARISON:  None.  FINDINGS: Hips are located. No evidence of pelvic fracture or sacral fracture. Limited view of the right hip demonstrates no femoral neck fracture. Irregularity of the inferior margin of the right acetabulum is similar to comparison CT and likely represents subchondral cystic change.  IMPRESSION: No clear evidence of pelvic fracture or hip fracture. If continued clinical concern, recommend CT or MRI pelvis.   Electronically Signed   By: Genevive Bi M.D.   On: 04/27/2014 17:13   Dg Femur Right  04/27/2014   CLINICAL DATA:  Fall from bed. Bleeding with limited extension of the lower extremity. Initial encounter.  EXAM: RIGHT FEMUR - 2 VIEW  COMPARISON:  None.  FINDINGS: The bones are diffusely demineralized. There is no evidence acute fracture or dislocation. Mild degenerative changes are present at the right hip. There are moderate tricompartmental degenerative changes at the right knee. Scattered vascular  calcifications are noted.  IMPRESSION: Osteopenia without evidence of acute fracture or dislocation.   Electronically Signed   By: Roxy Horseman M.D.   On: 04/27/2014 17:12   Ct Head Wo Contrast  04/27/2014   CLINICAL DATA:  Patient arrived to ED from Nursing care facility for evaluation of unwitnessed fall. Patient was found on the floor next to her bed face down, bleeding from her forehead. She was covered in blood from from head to waist, patient with area of black and blue bruising/lqceration rt sided frontal region- Pt did not provide any hx, just that it hurts- Possible demented, garbled confused speech  EXAM: CT HEAD WITHOUT CONTRAST  CT CERVICAL SPINE WITHOUT CONTRAST  TECHNIQUE: Multidetector CT imaging of the head and cervical spine was performed following the standard protocol without intravenous contrast. Multiplanar CT image reconstructions of the cervical spine were also generated.  COMPARISON:  None.  FINDINGS: CT HEAD FINDINGS  Examination demonstrates mild age related atrophic change. The ventricles and cisterns are otherwise unremarkable. There is evidence of mild chronic ischemic microvascular disease. There is an old small right occipital infarct and old small left peripheral cerebellar hemisphere infarct. No findings to suggest acute infarction. No mass, mass effect, shift of midline structures or acute hemorrhage. There is a small right frontal scalp contusion. Remaining bony structures are unremarkable.  CT CERVICAL SPINE FINDINGS  There is mild curvature of the cervical spine convex to the left. There is moderate spondylosis throughout the cervical spine with bridging right paravertebral osteophytes over the mid to lower cervical spine. There is significant multilevel disc space narrowing most severe at the C4-5 level. There is moderate uncovertebral joint spurring and facet arthropathy with possible ankylosis of the right facets over the C3-C5 region. Prevertebral soft tissues are within  normal. There is no definite acute fracture or subluxation. Remaining bony and soft tissue structures as well as the lung apices are unremarkable.  IMPRESSION: No acute intracranial findings.  Small right frontal scalp contusion.  No fracture.  Mild chronic small vessel ischemic disease with age related atrophic change. 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  No acute cervical spine injury.  Moderate spondylosis of the cervical spine with moderate multilevel disc disease. Lateral curvature of the cervical spine convex to the left with moderate bridging right paravertebral osteophytes over the mid to lower cervical spine and possible ankylosis of the right facet joints over the upper cervical spine.   Electronically Signed   By: Elberta Fortisaniel  Boyle M.D.   On: 04/27/2014 16:28   Ct Cervical Spine Wo Contrast  04/27/2014   CLINICAL DATA:  Patient arrived to ED from Nursing care facility for evaluation of unwitnessed fall. Patient was found on the floor next to her bed face down, bleeding from her forehead. She was covered in blood from from head to waist, patient with area of black and blue bruising/lqceration rt sided frontal region- Pt did not provide any hx, just that it hurts- Possible demented, garbled confused speech  EXAM: CT HEAD WITHOUT CONTRAST  CT CERVICAL SPINE WITHOUT CONTRAST  TECHNIQUE: Multidetector CT imaging of the head and cervical spine was performed following the standard protocol without intravenous contrast. Multiplanar CT image reconstructions of the cervical spine were also generated.  COMPARISON:  None.  FINDINGS: CT HEAD FINDINGS  Examination demonstrates mild age related atrophic change. The ventricles and cisterns are otherwise unremarkable. There is evidence of mild chronic ischemic microvascular disease. There is an old small right occipital infarct and old small left peripheral cerebellar hemisphere infarct. No findings to suggest acute infarction. No mass, mass effect,  shift of midline structures or acute hemorrhage. There is a small right frontal scalp contusion. Remaining bony structures are unremarkable.  CT CERVICAL SPINE FINDINGS  There is mild curvature of the cervical spine convex to the left. There is moderate spondylosis throughout the cervical spine with bridging right paravertebral osteophytes over the mid to lower cervical spine. There is significant multilevel disc space narrowing most severe at the C4-5 level. There is moderate uncovertebral joint spurring and facet arthropathy with possible ankylosis of the right facets over the C3-C5 region. Prevertebral soft tissues are within normal.  There is no definite acute fracture or subluxation. Remaining bony and soft tissue structures as well as the lung apices are unremarkable.  IMPRESSION: No acute intracranial findings.  Small right frontal scalp contusion.  No fracture.  Mild chronic small vessel ischemic disease with age related atrophic change. 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  No acute cervical spine injury.  Moderate spondylosis of the cervical spine with moderate multilevel disc disease. Lateral curvature of the cervical spine convex to the left with moderate bridging right paravertebral osteophytes over the mid to lower cervical spine and possible ankylosis of the right facet joints over the upper cervical spine.   Electronically Signed   By: Elberta Fortisaniel  Boyle M.D.   On: 04/27/2014 16:28     EKG Interpretation None      MDM   Final diagnoses:  Head injury  Laceration of head, initial encounter    Patients has had extensive work up with CT head/neck , chest xray, hip/femur/pelvis x rays of all which are unremarkable. She had bleeding from her head but this has clotted and is very small with mainly abrasion. Will use Dermabond for wound care after it is cleaned.  Dr. Gwendolyn GrantWalden has seen patient as well and agrees with work-up and treatment plan. Pt is cleared to return to facility.  Daughter requests pain medication for patient.  80 y.o.[name] evaluation in the Emergency Department is complete. It has been determined that no acute conditions requiring further emergency intervention are present at this time. The patient/guardian have been advised of the diagnosis and plan. We have discussed signs and symptoms that warrant return to the ED, such as changes or worsening in symptoms.  Vital signs are stable at discharge. Filed Vitals:   04/27/14 1730  BP: 118/41  Pulse: 57  Temp:   Resp:     Patient/guardian has voiced understanding and agreed to follow-up with the PCP or specialist.      Dorthula Matasiffany G Tarsha Blando, PA-C 04/27/14 1806  Elwin MochaBlair Walden, MD 04/27/14 2150

## 2014-04-27 NOTE — ED Notes (Signed)
[facility] Pt fell out of bed that was approximately 18inches off the ground. When staff found pt in floor pt was lying in blood down to waist. Bleeding is controlled at this time. Pt is currently at baseline status.

## 2014-04-27 NOTE — ED Notes (Signed)
[facility]

## 2014-04-27 NOTE — Discharge Instructions (Signed)
Facial Laceration  A facial laceration is a cut on the face. These injuries can be painful and cause bleeding. Lacerations usually heal quickly, but they need special care to reduce scarring. DIAGNOSIS  Your health care provider will take a medical history, ask for details about how the injury occurred, and examine the wound to determine how deep the cut is. TREATMENT  Some facial lacerations may not require closure. Others may not be able to be closed because of an increased risk of infection. The risk of infection and the chance for successful closure will depend on various factors, including the amount of time since the injury occurred. The wound may be cleaned to help prevent infection. If closure is appropriate, pain medicines may be given if needed. Your health care provider will use stitches (sutures), wound glue (adhesive), or skin adhesive strips to repair the laceration. These tools bring the skin edges together to allow for faster healing and a better cosmetic outcome. If needed, you may also be given a tetanus shot. HOME CARE INSTRUCTIONS  Only take over-the-counter or prescription medicines as directed by your health care provider.  Follow your health care provider's instructions for wound care. These instructions will vary depending on the technique used for closing the wound. For Sutures:  Keep the wound clean and dry.   If you were given a bandage (dressing), you should change it at least once a day. Also change the dressing if it becomes wet or dirty, or as directed by your health care provider.   Wash the wound with soap and water 2 times a day. Rinse the wound off with water to remove all soap. Pat the wound dry with a clean towel.   After cleaning, apply a thin layer of the antibiotic ointment recommended by your health care provider. This will help prevent infection and keep the dressing from sticking.   You may shower as usual after the first 24 hours. Do not soak the  wound in water until the sutures are removed.   Get your sutures removed as directed by your health care provider. With facial lacerations, sutures should usually be taken out after 4-5 days to avoid stitch marks.   Wait a few days after your sutures are removed before applying any makeup. For Skin Adhesive Strips:  Keep the wound clean and dry.   Do not get the skin adhesive strips wet. You may bathe carefully, using caution to keep the wound dry.   If the wound gets wet, pat it dry with a clean towel.   Skin adhesive strips will fall off on their own. You may trim the strips as the wound heals. Do not remove skin adhesive strips that are still stuck to the wound. They will fall off in time.  For Wound Adhesive:  You may briefly wet your wound in the shower or bath. Do not soak or scrub the wound. Do not swim. Avoid periods of heavy sweating until the skin adhesive has fallen off on its own. After showering or bathing, gently pat the wound dry with a clean towel.   Do not apply liquid medicine, cream medicine, ointment medicine, or makeup to your wound while the skin adhesive is in place. This may loosen the film before your wound is healed.   If a dressing is placed over the wound, be careful not to apply tape directly over the skin adhesive. This may cause the adhesive to be pulled off before the wound is healed.   Avoid   prolonged exposure to sunlight or tanning lamps while the skin adhesive is in place.  The skin adhesive will usually remain in place for 5-10 days, then naturally fall off the skin. Do not pick at the adhesive film.  After Healing: Once the wound has healed, cover the wound with sunscreen during the day for 1 full year. This can help minimize scarring. Exposure to ultraviolet light in the first year will darken the scar. It can take 1-2 years for the scar to lose its redness and to heal completely.  SEEK IMMEDIATE MEDICAL CARE IF:  You have redness, pain, or  swelling around the wound.   You see ayellowish-white fluid (pus) coming from the wound.   You have chills or a fever.  MAKE SURE YOU:  Understand these instructions.  Will watch your condition.  Will get help right away if you are not doing well or get worse. Document Released: 07/01/2004 Document Revised: 03/14/2013 Document Reviewed: 01/04/2013 Exit[employer] Patient Information 2015 [employer]. This information is not intended to replace advice given to you by your health care provider. Make sure you discuss any questions you have with your health care provider.  

## 2014-06-25 DIAGNOSIS — M6281 Muscle weakness (generalized): Secondary | ICD-10-CM | POA: Diagnosis not present

## 2014-06-25 DIAGNOSIS — I130 Hypertensive heart and chronic kidney disease with heart failure and stage 1 through stage 4 chronic kidney disease, or unspecified chronic kidney disease: Secondary | ICD-10-CM | POA: Diagnosis not present

## 2014-06-25 DIAGNOSIS — N184 Chronic kidney disease, stage 4 (severe): Secondary | ICD-10-CM | POA: Diagnosis not present

## 2014-06-25 DIAGNOSIS — F411 Generalized anxiety disorder: Secondary | ICD-10-CM | POA: Diagnosis not present

## 2014-07-06 DIAGNOSIS — I509 Heart failure, unspecified: Secondary | ICD-10-CM | POA: Diagnosis not present

## 2014-07-06 DIAGNOSIS — M6281 Muscle weakness (generalized): Secondary | ICD-10-CM | POA: Diagnosis not present

## 2014-07-30 DIAGNOSIS — N184 Chronic kidney disease, stage 4 (severe): Secondary | ICD-10-CM | POA: Diagnosis not present

## 2014-07-30 DIAGNOSIS — M6281 Muscle weakness (generalized): Secondary | ICD-10-CM | POA: Diagnosis not present

## 2014-07-30 DIAGNOSIS — F411 Generalized anxiety disorder: Secondary | ICD-10-CM | POA: Diagnosis not present

## 2014-07-30 DIAGNOSIS — K219 Gastro-esophageal reflux disease without esophagitis: Secondary | ICD-10-CM | POA: Diagnosis not present

## 2014-08-05 DIAGNOSIS — I509 Heart failure, unspecified: Secondary | ICD-10-CM | POA: Diagnosis not present

## 2014-08-05 DIAGNOSIS — M6281 Muscle weakness (generalized): Secondary | ICD-10-CM | POA: Diagnosis not present

## 2014-12-09 ENCOUNTER — Emergency Department (HOSPITAL_COMMUNITY): Payer: Medicare Other

## 2014-12-09 ENCOUNTER — Encounter (HOSPITAL_COMMUNITY): Payer: Self-pay | Admitting: *Deleted

## 2014-12-09 ENCOUNTER — Emergency Department (HOSPITAL_COMMUNITY)
Admission: EM | Admit: 2014-12-09 | Discharge: 2014-12-09 | Disposition: A | Discharge status: Home / Self Care | Payer: Medicare Other | Attending: Emergency Medicine | Admitting: Emergency Medicine

## 2014-12-09 DIAGNOSIS — I10 Essential (primary) hypertension: Secondary | ICD-10-CM | POA: Insufficient documentation

## 2014-12-09 DIAGNOSIS — Z79899 Other long term (current) drug therapy: Secondary | ICD-10-CM | POA: Insufficient documentation

## 2014-12-09 DIAGNOSIS — R1110 Vomiting, unspecified: Secondary | ICD-10-CM

## 2014-12-09 DIAGNOSIS — N390 Urinary tract infection, site not specified: Secondary | ICD-10-CM | POA: Diagnosis not present

## 2014-12-09 LAB — TYPE AND SCREEN
ABO/RH(D): B POS
Antibody Screen: NEGATIVE

## 2014-12-09 LAB — COMPREHENSIVE METABOLIC PANEL
ALK PHOS: 98 U/L (ref 38–126)
ALT: 11 U/L — AB (ref 14–54)
AST: 17 U/L (ref 15–41)
Albumin: 3.6 g/dL (ref 3.5–5.0)
Anion gap: 13 (ref 5–15)
BUN: 45 mg/dL — ABNORMAL HIGH (ref 6–20)
CALCIUM: 8.9 mg/dL (ref 8.9–10.3)
CHLORIDE: 102 mmol/L (ref 101–111)
CO2: 25 mmol/L (ref 22–32)
Creatinine, Ser: 1.74 mg/dL — ABNORMAL HIGH (ref 0.44–1.00)
GFR calc Af Amer: 29 mL/min — ABNORMAL LOW (ref >60)
GFR calc non Af Amer: 25 mL/min — ABNORMAL LOW (ref >60)
GLUCOSE: 127 mg/dL — AB (ref 65–99)
POTASSIUM: 3.4 mmol/L — AB (ref 3.5–5.1)
SODIUM: 140 mmol/L (ref 135–145)
TOTAL PROTEIN: 7 g/dL (ref 6.5–8.1)
Total Bilirubin: 0.5 mg/dL (ref 0.3–1.2)

## 2014-12-09 LAB — URINALYSIS, ROUTINE W REFLEX MICROSCOPIC
BILIRUBIN URINE: NEGATIVE
Glucose, UA: NEGATIVE mg/dL
Ketones, ur: NEGATIVE mg/dL
NITRITE: POSITIVE — AB
PROTEIN: NEGATIVE mg/dL
Specific Gravity, Urine: 1.009 (ref 1.005–1.030)
UROBILINOGEN UA: 0.2 mg/dL (ref 0.0–1.0)
pH: 5.5 (ref 5.0–8.0)

## 2014-12-09 LAB — CBC WITH DIFFERENTIAL/PLATELET
BASOS PCT: 1 % (ref 0–1)
Basophils Absolute: 0.1 10*3/uL (ref 0.0–0.1)
EOS ABS: 0.2 10*3/uL (ref 0.0–0.7)
Eosinophils Relative: 3 % (ref 0–5)
HCT: 41.9 % (ref 36.0–46.0)
HEMOGLOBIN: 14.4 g/dL (ref 12.0–15.0)
LYMPHS PCT: 26 % (ref 12–46)
Lymphs Abs: 2.2 10*3/uL (ref 0.7–4.0)
MCH: 29.3 pg (ref 26.0–34.0)
MCHC: 34.4 g/dL (ref 30.0–36.0)
MCV: 85.2 fL (ref 78.0–100.0)
MONO ABS: 1 10*3/uL (ref 0.1–1.0)
Monocytes Relative: 11 % (ref 3–12)
Neutro Abs: 5 10*3/uL (ref 1.7–7.7)
Neutrophils Relative %: 59 % (ref 43–77)
PLATELETS: 262 10*3/uL (ref 150–400)
RBC: 4.92 MIL/uL (ref 3.87–5.11)
RDW: 12.9 % (ref 11.5–15.5)
WBC: 8.4 10*3/uL (ref 4.0–10.5)

## 2014-12-09 LAB — URINE MICROSCOPIC-ADD ON

## 2014-12-09 LAB — POC OCCULT BLOOD, ED: Fecal Occult Bld: NEGATIVE

## 2014-12-09 LAB — CBG MONITORING, ED: Glucose-Capillary: 121 mg/dL — ABNORMAL HIGH (ref 65–99)

## 2014-12-09 LAB — PROTIME-INR
INR: 1.04 (ref 0.00–1.49)
Prothrombin Time: 13.8 seconds (ref 11.6–15.2)

## 2014-12-09 MED ORDER — SODIUM CHLORIDE 0.9 % IV BOLUS (SEPSIS)
500.0000 mL | Freq: Once | INTRAVENOUS | Status: AC
  Administered 2014-12-09: 500 mL via INTRAVENOUS

## 2014-12-09 MED ORDER — CEPHALEXIN 250 MG PO CAPS: 250.0000 mg | ORAL_CAPSULE | Freq: Four times a day (QID) | ORAL | Status: DC

## 2014-12-09 MED ORDER — DEXTROSE 5 % IV SOLN
1.0000 g | Freq: Once | INTRAVENOUS | Status: AC
  Administered 2014-12-09: 1 g via INTRAVENOUS
  Filled 2014-12-09: qty 10

## 2014-12-09 NOTE — ED Notes (Signed)
Per [facility].  Staff witnessed pt vomiting 5-6 times today. When EMS arrived, they noted coffee ground like emesis in the trash can.  The facility called EMS to bring pt to hospital for further eval. VS are as follows: BP:160/80 HR:66 CBG:171 O2sat: 96% on RA

## 2014-12-09 NOTE — ED Provider Notes (Signed)
CSN: [id number]     Arrival date & time 12/09/14  1817 History   First MD Initiated Contact with Patient 12/09/14 1818     Chief Complaint  Patient presents with  . Vomiting     (Consider location/radiation/quality/duration/timing/severity/associated sxs/prior Treatment) HPI 81 year old female who is sent here from a nursing home. They report that she vomited multiple times today. EMS reported  that it may have had coffee ground type material in it. No evidence of gross blood noted. No reported history of GI bleeding. Past Medical History  Diagnosis Date  . Hypertension    History reviewed. No pertinent past surgical history. No family history on file. History  Substance Use Topics  . Smoking status: Never Smoker   . Smokeless tobacco: Not on file  . Alcohol Use: No   OB History    No data available     Review of Systems  All other systems reviewed and are negative.     Allergies  Review of patient's allergies indicates no known allergies.  Home Medications   Prior to Admission medications   Medication Sig Start Date End Date Taking? Authorizing Provider  amLODipine (NORVASC) 10 MG tablet Take 10 mg by mouth daily. 04/02/14   Historical Provider, MD  cetirizine (ZYRTEC) 10 MG tablet Take 10 mg by mouth daily.    Historical Provider, MD  furosemide (LASIX) 40 MG tablet Take 40 mg by mouth daily. 04/02/14   Historical Provider, MD  metoprolol (LOPRESSOR) 50 MG tablet Take 50 mg by mouth 2 (two) times daily. 03/17/14   Historical Provider, MD  Multiple Vitamins-Minerals (CENTRUM PO) Take 1 tablet by mouth daily.    Historical Provider, MD  omeprazole (PRILOSEC) 20 MG capsule Take 20 mg by mouth daily. 04/02/14   Historical Provider, MD   BP 120/54 mmHg  Pulse 59  Temp(Src) 98.6 F (37 C) (Rectal)  Resp 14  SpO2 93% Physical Exam  Constitutional: She appears well-developed and well-nourished. No distress.  HENT:  Head: Normocephalic and atraumatic.  Nose: Nose  normal.  Mouth/Throat: Oropharynx is clear and moist.  Eyes: Conjunctivae are normal. Pupils are equal, round, and reactive to light.  Neck: Normal range of motion. Neck supple.  Cardiovascular: Normal rate, regular rhythm, normal heart sounds and intact distal pulses.   Pulmonary/Chest: Effort normal and breath sounds normal.  Abdominal: Soft. Bowel sounds are normal.  Genitourinary: Guaiac negative stool.  Musculoskeletal: Normal range of motion.  Neurological: She is alert.  Skin: Skin is warm.  Psychiatric: She has a normal mood and affect.  Nursing note and vitals reviewed.   ED Course  Procedures (including critical care time) Labs Review Labs Reviewed  COMPREHENSIVE METABOLIC PANEL - Abnormal; Notable for the following:    Potassium 3.4 (*)    Glucose, Bld 127 (*)    BUN 45 (*)    Creatinine, Ser 1.74 (*)    ALT 11 (*)    GFR calc non Af Amer 25 (*)    GFR calc Af Amer 29 (*)    All other components within normal limits  URINALYSIS, ROUTINE W REFLEX MICROSCOPIC (NOT AT [facility]) - Abnormal; Notable for the following:    APPearance CLOUDY (*)    Hgb urine dipstick SMALL (*)    Nitrite POSITIVE (*)    Leukocytes, UA LARGE (*)    All other components within normal limits  URINE MICROSCOPIC-ADD ON - Abnormal; Notable for the following:    Squamous Epithelial / LPF MANY (*)  Bacteria, UA MANY (*)    All other components within normal limits  CBG MONITORING, ED - Abnormal; Notable for the following:    Glucose-Capillary 121 (*)    All other components within normal limits  CBC WITH DIFFERENTIAL/PLATELET  PROTIME-INR  POC OCCULT BLOOD, ED  TYPE AND SCREEN    Imaging Review Dg Chest 2 View  12/09/2014   CLINICAL DATA:  Vomiting.  History of hypertension.  EXAM: CHEST  2 VIEW  COMPARISON:  April 27, 2014  FINDINGS: Mediastinal contour is normal. The heart size is upper limits of normal. The lung volumes are low. There is no focal infiltrate, pulmonary edema, or pleural  effusion. The visualized skeletal structures are stable.  IMPRESSION: No active cardiopulmonary disease.   Electronically Signed   By: Sherian Rein M.D.   On: 12/09/2014 19:29     EKG Interpretation None      MDM   Final diagnoses:  Vomiting  UTI (lower urinary tract infection)    81 y.o. female who presents today with vomiting. No evidence of GI bleeding with negative Hemoccults. Workup here is significant for urinary tract infection. She is given a gram of Rocephin and will be placed on Keflex. Patient has been given by mouth challenge here and has tolerated oral fluids without difficulty    Margarita Grizzle, MD 12/09/14 2217

## 2014-12-09 NOTE — ED Notes (Signed)
PA at bedside.

## 2014-12-09 NOTE — ED Notes (Signed)
Pt leaving for XRay. 

## 2014-12-09 NOTE — Discharge Instructions (Signed)
Urinary Tract Infection A urinary tract infection (UTI) can occur any place along the urinary tract. The tract includes the kidneys, ureters, bladder, and urethra. A type of germ called bacteria often causes a UTI. UTIs are often helped with antibiotic medicine.  HOME CARE   If given, take antibiotics as told by your doctor. Finish them even if you start to feel better.  Drink enough fluids to keep your pee (urine) clear or pale yellow.  Avoid tea, drinks with caffeine, and bubbly (carbonated) drinks.  Pee often. Avoid holding your pee in for a long time.  Pee before and after having sex (intercourse).  Wipe from front to back after you poop (bowel movement) if you are a woman. Use each tissue only once. GET HELP RIGHT AWAY IF:   You have back pain.  You have lower belly (abdominal) pain.  You have chills.  You feel sick to your stomach (nauseous).  You throw up (vomit).  Your burning or discomfort with peeing does not go away.  You have a fever.  Your symptoms are not better in 3 days. MAKE SURE YOU:   Understand these instructions.  Will watch your condition.  Will get help right away if you are not doing well or get worse. Document Released: 11/10/2007 Document Revised: 02/16/2012 Document Reviewed: 12/23/2011 [facility] Patient Information 2015 [place], [place]. This information is not intended to replace advice given to you by your health care provider. Make sure you discuss any questions you have with your health care provider. Nausea and Vomiting Nausea is a sick feeling that often comes before throwing up (vomiting). Vomiting is a reflex where stomach contents come out of your mouth. Vomiting can cause severe loss of body fluids (dehydration). Children and elderly adults can become dehydrated quickly, especially if they also have diarrhea. Nausea and vomiting are symptoms of a condition or disease. It is important to find the cause of your symptoms. CAUSES   Direct  irritation of the stomach lining. This irritation can result from increased acid production (gastroesophageal reflux disease), infection, food poisoning, taking certain medicines (such as nonsteroidal anti-inflammatory drugs), alcohol use, or tobacco use.  Signals from the brain.These signals could be caused by a headache, heat exposure, an inner ear disturbance, increased pressure in the brain from injury, infection, a tumor, or a concussion, pain, emotional stimulus, or metabolic problems.  An obstruction in the gastrointestinal tract (bowel obstruction).  Illnesses such as diabetes, hepatitis, gallbladder problems, appendicitis, kidney problems, cancer, sepsis, atypical symptoms of a heart attack, or eating disorders.  Medical treatments such as chemotherapy and radiation.  Receiving medicine that makes you sleep (general anesthetic) during surgery. DIAGNOSIS Your caregiver may ask for tests to be done if the problems do not improve after a few days. Tests may also be done if symptoms are severe or if the reason for the nausea and vomiting is not clear. Tests may include:  Urine tests.  Blood tests.  Stool tests.  Cultures (to look for evidence of infection).  X-rays or other imaging studies. Test results can help your caregiver make decisions about treatment or the need for additional tests. TREATMENT You need to stay well hydrated. Drink frequently but in small amounts.You may wish to drink water, sports drinks, clear broth, or eat frozen ice pops or gelatin dessert to help stay hydrated.When you eat, eating slowly may help prevent nausea.There are also some antinausea medicines that may help prevent nausea. HOME CARE INSTRUCTIONS   Take all medicine as directed  by your caregiver.  If you do not have an appetite, do not force yourself to eat. However, you must continue to drink fluids.  If you have an appetite, eat a normal diet unless your caregiver tells you  differently.  Eat a variety of complex carbohydrates (rice, wheat, potatoes, bread), lean meats, yogurt, fruits, and vegetables.  Avoid high-fat foods because they are more difficult to digest.  Drink enough water and fluids to keep your urine clear or pale yellow.  If you are dehydrated, ask your caregiver for specific rehydration instructions. Signs of dehydration may include:  Severe thirst.  Dry lips and mouth.  Dizziness.  Dark urine.  Decreasing urine frequency and amount.  Confusion.  Rapid breathing or pulse. SEEK IMMEDIATE MEDICAL CARE IF:   You have blood or brown flecks (like coffee grounds) in your vomit.  You have black or bloody stools.  You have a severe headache or stiff neck.  You are confused.  You have severe abdominal pain.  You have chest pain or trouble breathing.  You do not urinate at least once every 8 hours.  You develop cold or clammy skin.  You continue to vomit for longer than 24 to 48 hours.  You have a fever. MAKE SURE YOU:   Understand these instructions.  Will watch your condition.  Will get help right away if you are not doing well or get worse. Document Released: 05/24/2005 Document Revised: 08/16/2011 Document Reviewed: 10/21/2010 [facility] Patient Information 2015 [place], [place]. This information is not intended to replace advice given to you by your health care provider. Make sure you discuss any questions you have with your health care provider.  Please take all antibiotics as prescribed.  Return if fever or not able to tolerate fluids.  She did not have any evidence of bleeding here- she did not vomit and stool checked was negative for blood.  However, if she has bleeding please send her back to the emergency department.

## 2014-12-09 NOTE — ED Notes (Signed)
This RN attempted to call pt's daughter, phone number listed in the patient's demographics but no answer. Will try again.

## 2014-12-09 NOTE — ED Notes (Signed)
[facility] at bedside [facility]

## 2015-11-03 ENCOUNTER — Emergency Department (HOSPITAL_COMMUNITY): Payer: Medicare Other

## 2015-11-03 ENCOUNTER — Emergency Department (HOSPITAL_COMMUNITY)
Admission: EM | Admit: 2015-11-03 | Discharge: 2015-11-03 | Disposition: A | Discharge status: Home / Self Care | Payer: Medicare Other | Attending: Emergency Medicine | Admitting: Emergency Medicine

## 2015-11-03 ENCOUNTER — Encounter (HOSPITAL_COMMUNITY): Payer: Self-pay | Admitting: Emergency Medicine

## 2015-11-03 DIAGNOSIS — N289 Disorder of kidney and ureter, unspecified: Secondary | ICD-10-CM | POA: Diagnosis not present

## 2015-11-03 DIAGNOSIS — I10 Essential (primary) hypertension: Secondary | ICD-10-CM | POA: Insufficient documentation

## 2015-11-03 DIAGNOSIS — N390 Urinary tract infection, site not specified: Secondary | ICD-10-CM | POA: Insufficient documentation

## 2015-11-03 DIAGNOSIS — F03918 Unspecified dementia, unspecified severity, with other behavioral disturbance: Secondary | ICD-10-CM

## 2015-11-03 DIAGNOSIS — F0391 Unspecified dementia with behavioral disturbance: Secondary | ICD-10-CM | POA: Diagnosis not present

## 2015-11-03 LAB — URINALYSIS, ROUTINE W REFLEX MICROSCOPIC
Bilirubin Urine: NEGATIVE
Glucose, UA: NEGATIVE mg/dL
Ketones, ur: NEGATIVE mg/dL
Nitrite: NEGATIVE
PROTEIN: NEGATIVE mg/dL
Specific Gravity, Urine: 1.012 (ref 1.005–1.030)
pH: 5.5 (ref 5.0–8.0)

## 2015-11-03 LAB — CBC WITH DIFFERENTIAL/PLATELET
BASOS ABS: 0.1 10*3/uL (ref 0.0–0.1)
Basophils Relative: 1 %
EOS PCT: 6 %
Eosinophils Absolute: 0.5 10*3/uL (ref 0.0–0.7)
HEMATOCRIT: 42.4 % (ref 36.0–46.0)
Hemoglobin: 14.4 g/dL (ref 12.0–15.0)
LYMPHS PCT: 34 %
Lymphs Abs: 3.1 10*3/uL (ref 0.7–4.0)
MCH: 29.8 pg (ref 26.0–34.0)
MCHC: 34 g/dL (ref 30.0–36.0)
MCV: 87.8 fL (ref 78.0–100.0)
Monocytes Absolute: 0.9 10*3/uL (ref 0.1–1.0)
Monocytes Relative: 10 %
Neutro Abs: 4.6 10*3/uL (ref 1.7–7.7)
Neutrophils Relative %: 51 %
PLATELETS: 202 10*3/uL (ref 150–400)
RBC: 4.83 MIL/uL (ref 3.87–5.11)
RDW: 14.6 % (ref 11.5–15.5)
WBC: 9.1 10*3/uL (ref 4.0–10.5)

## 2015-11-03 LAB — COMPREHENSIVE METABOLIC PANEL
ALT: 10 U/L — ABNORMAL LOW (ref 14–54)
ANION GAP: 9 (ref 5–15)
AST: 19 U/L (ref 15–41)
Albumin: 3.4 g/dL — ABNORMAL LOW (ref 3.5–5.0)
Alkaline Phosphatase: 72 U/L (ref 38–126)
BILIRUBIN TOTAL: 1 mg/dL (ref 0.3–1.2)
BUN: 37 mg/dL — ABNORMAL HIGH (ref 6–20)
CHLORIDE: 110 mmol/L (ref 101–111)
CO2: 24 mmol/L (ref 22–32)
Calcium: 8.5 mg/dL — ABNORMAL LOW (ref 8.9–10.3)
Creatinine, Ser: 2.03 mg/dL — ABNORMAL HIGH (ref 0.44–1.00)
GFR calc Af Amer: 24 mL/min — ABNORMAL LOW (ref >60)
GFR, EST NON AFRICAN AMERICAN: 20 mL/min — AB (ref >60)
Glucose, Bld: 84 mg/dL (ref 65–99)
POTASSIUM: 3.4 mmol/L — AB (ref 3.5–5.1)
Sodium: 143 mmol/L (ref 135–145)
TOTAL PROTEIN: 6.3 g/dL — AB (ref 6.5–8.1)

## 2015-11-03 LAB — URINE MICROSCOPIC-ADD ON

## 2015-11-03 MED ORDER — CEPHALEXIN 500 MG PO CAPS: 500.0000 mg | ORAL_CAPSULE | Freq: Three times a day (TID) | ORAL | Status: DC

## 2015-11-03 MED ORDER — CEPHALEXIN 500 MG PO CAPS
500.0000 mg | ORAL_CAPSULE | Freq: Once | ORAL | Status: AC
  Administered 2015-11-03: 500 mg via ORAL
  Filled 2015-11-03: qty 1

## 2015-11-03 NOTE — ED Notes (Signed)
Bed: [id number] Expected date:  Expected time:  Means of arrival:  Comments: [age] F medical eval

## 2015-11-03 NOTE — ED Notes (Signed)
[facility] report on pt was provided; discharge instructions and new prescription for Keflex was also given.

## 2015-11-03 NOTE — Discharge Instructions (Signed)
Urinary Tract Infection °Urinary tract infections (UTIs) can develop anywhere along your urinary tract. Your urinary tract is your body's drainage system for removing wastes and extra water. Your urinary tract includes two kidneys, two ureters, a bladder, and a urethra. Your kidneys are a pair of bean-shaped organs. Each kidney is about the size of your fist. They are located below your ribs, one on each side of your spine. °CAUSES °Infections are caused by microbes, which are microscopic organisms, including fungi, viruses, and bacteria. These organisms are so small that they can only be seen through a microscope. Bacteria are the microbes that most commonly cause UTIs. °SYMPTOMS  °Symptoms of UTIs may vary by age and gender of the patient and by the location of the infection. Symptoms in young women typically include a frequent and intense urge to urinate and a painful, burning feeling in the bladder or urethra during urination. Older women and men are more likely to be tired, shaky, and weak and have muscle aches and abdominal pain. A fever may mean the infection is in your kidneys. Other symptoms of a kidney infection include pain in your back or sides below the ribs, nausea, and vomiting. °DIAGNOSIS °To diagnose a UTI, your caregiver will ask you about your symptoms. Your caregiver will also ask you to provide a urine sample. The urine sample will be tested for bacteria and white blood cells. White blood cells are made by your body to help fight infection. °TREATMENT  °Typically, UTIs can be treated with medication. Because most UTIs are caused by a bacterial infection, they usually can be treated with the use of antibiotics. The choice of antibiotic and length of treatment depend on your symptoms and the type of bacteria causing your infection. °HOME CARE INSTRUCTIONS °· If you were prescribed antibiotics, take them exactly as your caregiver instructs you. Finish the medication even if you feel better after  you have only taken some of the medication. °· Drink enough water and fluids to keep your urine clear or pale yellow. °· Avoid caffeine, tea, and carbonated beverages. They tend to irritate your bladder. °· Empty your bladder often. Avoid holding urine for long periods of time. °· Empty your bladder before and after sexual intercourse. °· After a bowel movement, women should cleanse from front to back. Use each tissue only once. °SEEK MEDICAL CARE IF:  °· You have back pain. °· You develop a fever. °· Your symptoms do not begin to resolve within 3 days. °SEEK IMMEDIATE MEDICAL CARE IF:  °· You have severe back pain or lower abdominal pain. °· You develop chills. °· You have nausea or vomiting. °· You have continued burning or discomfort with urination. °MAKE SURE YOU:  °· Understand these instructions. °· Will watch your condition. °· Will get help right away if you are not doing well or get worse. °  °This information is not intended to replace advice given to you by your health care provider. Make sure you discuss any questions you have with your health care provider. °  °Document Released: 03/03/2005 Document Revised: 02/12/2015 Document Reviewed: 07/02/2011 °Elsevier Interactive Patient Education ©2016 Elsevier Inc. ° °Cephalexin tablets or capsules °What is this medicine? °CEPHALEXIN (sef a LEX in) is a cephalosporin antibiotic. It is used to treat certain kinds of bacterial infections It will not work for colds, flu, or other viral infections. °This medicine may be used for other purposes; ask your health care provider or pharmacist if you have questions. °What should I   tell my health care provider before I take this medicine? °They need to know if you have any of these conditions: °-kidney disease °-stomach or intestine problems, especially colitis °-an unusual or allergic reaction to cephalexin, other cephalosporins, penicillins, other antibiotics, medicines, foods, dyes or preservatives °-pregnant or trying  to get pregnant °-breast-feeding °How should I use this medicine? °Take this medicine by mouth with a full glass of water. Follow the directions on the prescription label. This medicine can be taken with or without food. Take your medicine at regular intervals. Do not take your medicine more often than directed. Take all of your medicine as directed even if you think you are better. Do not skip doses or stop your medicine early. °Talk to your pediatrician regarding the use of this medicine in children. While this drug may be prescribed for selected conditions, precautions do apply. °Overdosage: If you think you have taken too much of this medicine contact a poison control center or emergency room at once. °NOTE: This medicine is only for you. Do not share this medicine with others. °What if I miss a dose? °If you miss a dose, take it as soon as you can. If it is almost time for your next dose, take only that dose. Do not take double or extra doses. There should be at least 4 to 6 hours between doses. °What may interact with this medicine? °-probenecid °-some other antibiotics °This list may not describe all possible interactions. Give your health care provider a list of all the medicines, herbs, non-prescription drugs, or dietary supplements you use. Also tell them if you smoke, drink alcohol, or use illegal drugs. Some items may interact with your medicine. °What should I watch for while using this medicine? °Tell your doctor or health care professional if your symptoms do not begin to improve in a few days. °Do not treat diarrhea with over the counter products. Contact your doctor if you have diarrhea that lasts more than 2 days or if it is severe and watery. °If you have diabetes, you may get a false-positive result for sugar in your urine. Check with your doctor or health care professional. °What side effects may I notice from receiving this medicine? °Side effects that you should report to your doctor or health  care professional as soon as possible: °-allergic reactions like skin rash, itching or hives, swelling of the face, lips, or tongue °-breathing problems °-pain or trouble passing urine °-redness, blistering, peeling or loosening of the skin, including inside the mouth °-severe or watery diarrhea °-unusually weak or tired °-yellowing of the eyes, skin °Side effects that usually do not require medical attention (report to your doctor or health care professional if they continue or are bothersome): °-gas or heartburn °-genital or anal irritation °-headache °-joint or muscle pain °-nausea, vomiting °This list may not describe all possible side effects. Call your doctor for medical advice about side effects. You may report side effects to FDA at 1-800-FDA-1088. °Where should I keep my medicine? °Keep out of the reach of children. °Store at room temperature between 59 and 86 degrees F (15 and 30 degrees C). Throw away any unused medicine after the expiration date. °NOTE: This sheet is a summary. It may not cover all possible information. If you have questions about this medicine, talk to your doctor, pharmacist, or health care provider. °  °© 2016, Elsevier/Gold Standard. (2007-08-28 17:09:13) ° °

## 2015-11-03 NOTE — ED Notes (Signed)
[facility]

## 2015-11-03 NOTE — ED Notes (Signed)
[facility] for [facility].  Per EMS, staff at the facility reported that pt has been observed to be yelling "in her sleep".   Per facility staff, pt started yelling intermittently while in bed---- this behavior started yesterday night; other than "moaning"/"yelling", no other abnormal behavior is exhibited by pt.  Pt is reported to be normally non-verbal except saying "yes" repeatedly as answer to some questions.  Pt makes good eye contact and expressive responses (like smiling, laughing) when being spoken to.

## 2015-11-03 NOTE — ED Provider Notes (Signed)
CSN: [id number]     Arrival date & time 11/03/15  0439 History   First MD Initiated Contact with Patient 11/03/15 0459     Chief Complaint  Patient presents with  . Medical Evaluation      (Consider location/radiation/quality/duration/timing/severity/associated sxs/prior Treatment) The history is provided by the nursing home. The history is limited by the condition of the patient (Dementia).  82 year old female was sent here from the nursing home where she resides because she had altered mentation. Apparently, she was yelling in her sleep. This started yesterday. Patient is not able to give any history whatsoever.  Past Medical History  Diagnosis Date  . Hypertension    History reviewed. No pertinent past surgical history. History reviewed. No pertinent family history. Social History  Substance Use Topics  . Smoking status: Never Smoker   . Smokeless tobacco: None  . Alcohol Use: No   OB History    No data available     Review of Systems  Unable to perform ROS: Dementia      Allergies  Review of patient's allergies indicates no known allergies.  Home Medications   Prior to Admission medications   Medication Sig Start Date End Date Taking? Authorizing Provider  amLODipine (NORVASC) 10 MG tablet Take 10 mg by mouth daily. 04/02/14  Yes Historical Provider, MD  cetirizine (ZYRTEC) 10 MG tablet Take 10 mg by mouth daily.   Yes Historical Provider, MD  clonazePAM (KLONOPIN) 0.5 MG tablet Take 0.5 tablets by mouth 2 (two) times daily. 10/08/15  Yes Historical Provider, MD  divalproex (DEPAKOTE SPRINKLE) 125 MG capsule Take 250 mg by mouth 2 (two) times daily.   Yes Historical Provider, MD  furosemide (LASIX) 40 MG tablet Take 40 mg by mouth daily. 04/02/14  Yes Historical Provider, MD  metoprolol (LOPRESSOR) 50 MG tablet Take 25 mg by mouth 2 (two) times daily.  03/17/14  Yes Historical Provider, MD  Multiple Vitamins-Minerals (CENTRUM PO) Take 1 tablet by mouth daily.   Yes  Historical Provider, MD  omeprazole (PRILOSEC) 20 MG capsule Take 20 mg by mouth daily. 04/02/14  Yes Historical Provider, MD  traMADol (ULTRAM) 50 MG tablet Take 1 tablet by mouth 2 (two) times daily. 10/15/15  Yes Historical Provider, MD  cephALEXin (KEFLEX) 250 MG capsule Take 1 capsule (250 mg total) by mouth 4 (four) times daily. 12/09/14   Margarita Grizzleanielle Ray, MD   BP 106/40 mmHg  Pulse 55  Temp(Src) 97.7 F (36.5 C) (Axillary)  Resp 18  SpO2 96% Physical Exam  Nursing note and vitals reviewed.  82 year old female, resting comfortably and in no acute distress. Vital signs are significant for bradycardia. Oxygen saturation is 96%, which is normal. Head is normocephalic and atraumatic. PERRLA, EOMI. Oropharynx is clear. Neck is nontender and supple without adenopathy or JVD. Back is nontender and there is no CVA tenderness. Lungs are clear without rales, wheezes, or rhonchi. Chest is nontender. Heart has regular rate and rhythm with 1/6 holosystolic murmur. Abdomen is soft, flat, nontender without masses or hepatosplenomegaly and peristalsis is normoactive. Extremities have no cyanosis or edema, full range of motion is present. Skin is warm and dry without rash. Neurologic: She is awake and will smile, but is not conversant and does not follow commands, cranial nerves are intact, there are no motor or sensory deficits.  ED Course  Procedures (including critical care time) Labs Review Results for orders placed or performed during the hospital encounter of 11/03/15  Comprehensive metabolic panel  Result Value Ref Range   Sodium 143 135 - 145 mmol/L   Potassium 3.4 (L) 3.5 - 5.1 mmol/L   Chloride 110 101 - 111 mmol/L   CO2 24 22 - 32 mmol/L   Glucose, Bld 84 65 - 99 mg/dL   BUN 37 (H) 6 - 20 mg/dL   Creatinine, Ser 1.61 (H) 0.44 - 1.00 mg/dL   Calcium 8.5 (L) 8.9 - 10.3 mg/dL   Total Protein 6.3 (L) 6.5 - 8.1 g/dL   Albumin 3.4 (L) 3.5 - 5.0 g/dL   AST 19 15 - 41 U/L   ALT 10 (L) 14  - 54 U/L   Alkaline Phosphatase 72 38 - 126 U/L   Total Bilirubin 1.0 0.3 - 1.2 mg/dL   GFR calc non Af Amer 20 (L) >60 mL/min   GFR calc Af Amer 24 (L) >60 mL/min   Anion gap 9 5 - 15  CBC with Differential  Result Value Ref Range   WBC 9.1 4.0 - 10.5 K/uL   RBC 4.83 3.87 - 5.11 MIL/uL   Hemoglobin 14.4 12.0 - 15.0 g/dL   HCT 09.6 04.5 - 40.9 %   MCV 87.8 78.0 - 100.0 fL   MCH 29.8 26.0 - 34.0 pg   MCHC 34.0 30.0 - 36.0 g/dL   RDW 81.1 91.4 - 78.2 %   Platelets 202 150 - 400 K/uL   Neutrophils Relative % 51 %   Neutro Abs 4.6 1.7 - 7.7 K/uL   Lymphocytes Relative 34 %   Lymphs Abs 3.1 0.7 - 4.0 K/uL   Monocytes Relative 10 %   Monocytes Absolute 0.9 0.1 - 1.0 K/uL   Eosinophils Relative 6 %   Eosinophils Absolute 0.5 0.0 - 0.7 K/uL   Basophils Relative 1 %   Basophils Absolute 0.1 0.0 - 0.1 K/uL  Urinalysis, Routine w reflex microscopic  Result Value Ref Range   Color, Urine YELLOW YELLOW   APPearance TURBID (A) CLEAR   Specific Gravity, Urine 1.012 1.005 - 1.030   pH 5.5 5.0 - 8.0   Glucose, UA NEGATIVE NEGATIVE mg/dL   Hgb urine dipstick SMALL (A) NEGATIVE   Bilirubin Urine NEGATIVE NEGATIVE   Ketones, ur NEGATIVE NEGATIVE mg/dL   Protein, ur NEGATIVE NEGATIVE mg/dL   Nitrite NEGATIVE NEGATIVE   Leukocytes, UA LARGE (A) NEGATIVE  Urine microscopic-add on  Result Value Ref Range   Squamous Epithelial / LPF 6-30 (A) NONE SEEN   WBC, UA TOO NUMEROUS TO COUNT 0 - 5 WBC/hpf   RBC / HPF 6-30 0 - 5 RBC/hpf   Bacteria, UA MANY (A) NONE SEEN   Urine-Other YEAST PRESENT    Dg Chest Port 1 View  11/03/2015  CLINICAL DATA:  Altered mental status. EXAM: PORTABLE CHEST 1 VIEW COMPARISON:  12/09/2014 FINDINGS: Lung volumes are low. Cardiomediastinal contours are unchanged. Progressive increased interstitial markings from prior. No focal airspace disease. No large pleural effusion or pneumothorax. Chronic change about both shoulders. IMPRESSION: Progressive increased interstitial  markings from prior, may be progression of chronic change versus superimposed pulmonary edema. Electronically Signed   By: Rubye Oaks M.D.   On: 11/03/2015 06:28     Imaging Review Dg Chest Port 1 View  11/03/2015  CLINICAL DATA:  Altered mental status. EXAM: PORTABLE CHEST 1 VIEW COMPARISON:  12/09/2014 FINDINGS: Lung volumes are low. Cardiomediastinal contours are unchanged. Progressive increased interstitial markings from prior. No focal airspace disease. No large pleural effusion or pneumothorax. Chronic change about both shoulders.  IMPRESSION: Progressive increased interstitial markings from prior, may be progression of chronic change versus superimposed pulmonary edema. Electronically Signed   By: Rubye Oaks M.D.   On: 11/03/2015 06:28   I have personally reviewed and evaluated these images and lab results as part of my medical decision-making.   EKG Interpretation   Date/Time:  Monday Nov 03 2015 04:55:33 EDT Ventricular Rate:  52 PR Interval:  203 QRS Duration: 130 QT Interval:  470 QTC Calculation: 437 R Axis:   -61 Text Interpretation:  Sinus rhythm Supraventricular bigeminy RBBB and LAFB  Left ventricular hypertrophy Lateral infarct, age indeterminate When  compared with ECG of 12/09/2014, No significant change was found Confirmed  by [facility]  MD, Khalila Buechner ([id number]) on 11/03/2015 5:25:10 AM      MDM   Final diagnoses:  Dementia with behavioral disturbance  Urinary tract infection without hematuria, site unspecified  Renal insufficiency    Mental status change. There is nothing on exam to explain it. This may be simple progression of her underlying dementia. Bradycardia is secondary to metoprolol and is not severe. However, it may be worthwhile of reducing the dose to see if improved cardiac output might be helpful. Screening labs are obtained looking mainly for evidence of occult infection.  Laboratory workup does show renal insufficiency, but this is unchanged from  baseline. Urinalysis is suspicious for urinary tract infection with too numerous to count WBCs and many bacteria, but yeast are also present. Specimen will be sent for culture and she will be placed on cephalexin. Chest x-ray showed no evidence of pneumonia and CBC is normal. At this point, I am not sure if her behavioral disturbances related to a year and a tract infection or just progression of her disease. She is to follow-up with her physician after completing her course of antibiotics.  Dione Booze, MD 11/03/15 [phone]

## 2015-11-05 LAB — URINE CULTURE: Culture: >=100000 — AB

## 2015-11-06 ENCOUNTER — Telehealth: Payer: Self-pay | Admitting: *Deleted

## 2015-11-06 NOTE — ED Notes (Signed)
Post ED Visit - Positive Culture Follow-up  Culture report reviewed by antimicrobial stewardship pharmacist:  []  [name], Pharm.D. []  [name], Pharm.D., BCPS []  [name], Pharm.D. []  [name], [address] BCPS []  [name], [address] BCPS, AAHIVP []  [name], Pharm.D., BCPS, AAHIVP []  [name], [address] []  [name], [address]  Positive culture, contaminated No further patient follow-up is required at this time per Fayrene HelperBowie Tran, PA-C  [name], [name] 11/06/2015, 1:13 PM

## 2015-11-06 NOTE — Progress Notes (Signed)
ED Antimicrobial Stewardship Positive Culture Follow Up   [name] is an 82 y.o. female who [facility] on [facility]2015 with a chief complaint of  Chief Complaint  Patient presents with  . Medical Evaluation     Recent Results (from the past 720 hour(s))  Urine culture     Status: Abnormal   Collection Time: 11/03/15  6:20 AM  Result Value Ref Range Status   Specimen Description URINE, CATHETERIZED  Final   Special Requests NONE  Final   Culture >=100,000 COLONIES/mL ENTEROCOCCUS SPECIES (A)  Final   Report Status 11/05/2015 FINAL  Final   Organism ID, Bacteria ENTEROCOCCUS SPECIES (A)  Final      Susceptibility   Enterococcus species - MIC*    AMPICILLIN <=2 SENSITIVE Sensitive     LEVOFLOXACIN >=8 RESISTANT Resistant     NITROFURANTOIN <=16 SENSITIVE Sensitive     VANCOMYCIN 1 SENSITIVE Sensitive     * >=100,000 COLONIES/mL ENTEROCOCCUS SPECIES    Urinary culture contaminated. No fever - no treatment indicated.  ED Provider: Fayrene HelperBowie Tran, PA-C  Boy Delamater L. Roseanne RenoStewart, PharmD PGY2 Infectious Diseases Pharmacy Resident Pager: [phone] 11/06/2015 10:34 AM

## 2015-11-08 ENCOUNTER — Emergency Department (HOSPITAL_COMMUNITY): Payer: Medicare Other

## 2015-11-08 ENCOUNTER — Inpatient Hospital Stay (HOSPITAL_COMMUNITY)
Admission: EM | Admit: 2015-11-08 | Discharge: 2015-11-11 | DRG: 682 | Disposition: A | Discharge status: Skilled Nursing Facility | Payer: Medicare Other | Attending: Internal Medicine | Admitting: Internal Medicine

## 2015-11-08 ENCOUNTER — Encounter (HOSPITAL_COMMUNITY): Payer: Self-pay

## 2015-11-08 DIAGNOSIS — Z79899 Other long term (current) drug therapy: Secondary | ICD-10-CM

## 2015-11-08 DIAGNOSIS — K219 Gastro-esophageal reflux disease without esophagitis: Secondary | ICD-10-CM

## 2015-11-08 DIAGNOSIS — R400 Somnolence: Secondary | ICD-10-CM | POA: Insufficient documentation

## 2015-11-08 DIAGNOSIS — R778 Other specified abnormalities of plasma proteins: Secondary | ICD-10-CM | POA: Diagnosis present

## 2015-11-08 DIAGNOSIS — I130 Hypertensive heart and chronic kidney disease with heart failure and stage 1 through stage 4 chronic kidney disease, or unspecified chronic kidney disease: Secondary | ICD-10-CM | POA: Diagnosis present

## 2015-11-08 DIAGNOSIS — B952 Enterococcus as the cause of diseases classified elsewhere: Secondary | ICD-10-CM | POA: Insufficient documentation

## 2015-11-08 DIAGNOSIS — E876 Hypokalemia: Secondary | ICD-10-CM | POA: Diagnosis present

## 2015-11-08 DIAGNOSIS — F419 Anxiety disorder, unspecified: Secondary | ICD-10-CM | POA: Diagnosis present

## 2015-11-08 DIAGNOSIS — I959 Hypotension, unspecified: Secondary | ICD-10-CM | POA: Diagnosis not present

## 2015-11-08 DIAGNOSIS — F0390 Unspecified dementia without behavioral disturbance: Secondary | ICD-10-CM

## 2015-11-08 DIAGNOSIS — R739 Hyperglycemia, unspecified: Secondary | ICD-10-CM | POA: Diagnosis present

## 2015-11-08 DIAGNOSIS — L8990 Pressure ulcer of unspecified site, unspecified stage: Secondary | ICD-10-CM | POA: Insufficient documentation

## 2015-11-08 DIAGNOSIS — N184 Chronic kidney disease, stage 4 (severe): Secondary | ICD-10-CM | POA: Diagnosis present

## 2015-11-08 DIAGNOSIS — F0391 Unspecified dementia with behavioral disturbance: Secondary | ICD-10-CM | POA: Diagnosis present

## 2015-11-08 DIAGNOSIS — E869 Volume depletion, unspecified: Secondary | ICD-10-CM | POA: Diagnosis not present

## 2015-11-08 DIAGNOSIS — G9341 Metabolic encephalopathy: Secondary | ICD-10-CM | POA: Diagnosis present

## 2015-11-08 DIAGNOSIS — N390 Urinary tract infection, site not specified: Secondary | ICD-10-CM

## 2015-11-08 DIAGNOSIS — N179 Acute kidney failure, unspecified: Secondary | ICD-10-CM | POA: Diagnosis not present

## 2015-11-08 DIAGNOSIS — G9340 Encephalopathy, unspecified: Secondary | ICD-10-CM | POA: Diagnosis present

## 2015-11-08 DIAGNOSIS — R4182 Altered mental status, unspecified: Secondary | ICD-10-CM | POA: Diagnosis present

## 2015-11-08 DIAGNOSIS — L89151 Pressure ulcer of sacral region, stage 1: Secondary | ICD-10-CM | POA: Diagnosis present

## 2015-11-08 DIAGNOSIS — E870 Hyperosmolality and hypernatremia: Secondary | ICD-10-CM | POA: Diagnosis not present

## 2015-11-08 DIAGNOSIS — E860 Dehydration: Secondary | ICD-10-CM | POA: Diagnosis present

## 2015-11-08 DIAGNOSIS — Z66 Do not resuscitate: Secondary | ICD-10-CM | POA: Diagnosis present

## 2015-11-08 HISTORY — DX: Chronic kidney disease, stage 4 (severe): N18.4

## 2015-11-08 HISTORY — DX: Gastro-esophageal reflux disease without esophagitis: K21.9

## 2015-11-08 HISTORY — DX: Unspecified dementia, unspecified severity, without behavioral disturbance, psychotic disturbance, mood disturbance, and anxiety: F03.90

## 2015-11-08 HISTORY — DX: Unspecified osteoarthritis, unspecified site: M19.90

## 2015-11-08 HISTORY — DX: Heart failure, unspecified: I50.9

## 2015-11-08 LAB — VALPROIC ACID LEVEL

## 2015-11-08 LAB — TROPONIN I
TROPONIN I: 0.15 ng/mL — AB (ref <0.031)
TROPONIN I: 0.22 ng/mL — AB (ref <0.031)
Troponin I: 0.12 ng/mL — ABNORMAL HIGH (ref <0.031)

## 2015-11-08 LAB — URINE MICROSCOPIC-ADD ON

## 2015-11-08 LAB — CBC WITH DIFFERENTIAL/PLATELET
BASOS ABS: 0.1 10*3/uL (ref 0.0–0.1)
BASOS PCT: 0 %
EOS ABS: 0.3 10*3/uL (ref 0.0–0.7)
EOS PCT: 2 %
HEMATOCRIT: 48.5 % — AB (ref 36.0–46.0)
Hemoglobin: 16.2 g/dL — ABNORMAL HIGH (ref 12.0–15.0)
Lymphocytes Relative: 13 %
Lymphs Abs: 1.9 10*3/uL (ref 0.7–4.0)
MCH: 29.9 pg (ref 26.0–34.0)
MCHC: 33.4 g/dL (ref 30.0–36.0)
MCV: 89.5 fL (ref 78.0–100.0)
MONO ABS: 1.2 10*3/uL — AB (ref 0.1–1.0)
MONOS PCT: 8 %
Neutro Abs: 11.9 10*3/uL — ABNORMAL HIGH (ref 1.7–7.7)
Neutrophils Relative %: 77 %
PLATELETS: 233 10*3/uL (ref 150–400)
RBC: 5.42 MIL/uL — ABNORMAL HIGH (ref 3.87–5.11)
RDW: 15.1 % (ref 11.5–15.5)
WBC: 15.4 10*3/uL — ABNORMAL HIGH (ref 4.0–10.5)

## 2015-11-08 LAB — COMPREHENSIVE METABOLIC PANEL
ALBUMIN: 3.1 g/dL — AB (ref 3.5–5.0)
ALK PHOS: 92 U/L (ref 38–126)
ALT: 11 U/L — AB (ref 14–54)
ANION GAP: 14 (ref 5–15)
AST: 18 U/L (ref 15–41)
BILIRUBIN TOTAL: 1 mg/dL (ref 0.3–1.2)
BUN: 55 mg/dL — AB (ref 6–20)
CALCIUM: 8.8 mg/dL — AB (ref 8.9–10.3)
CO2: 24 mmol/L (ref 22–32)
CREATININE: 2.4 mg/dL — AB (ref 0.44–1.00)
Chloride: 111 mmol/L (ref 101–111)
GFR calc Af Amer: 19 mL/min — ABNORMAL LOW (ref >60)
GFR calc non Af Amer: 17 mL/min — ABNORMAL LOW (ref >60)
GLUCOSE: 101 mg/dL — AB (ref 65–99)
Potassium: 3.2 mmol/L — ABNORMAL LOW (ref 3.5–5.1)
SODIUM: 149 mmol/L — AB (ref 135–145)
TOTAL PROTEIN: 6.9 g/dL (ref 6.5–8.1)

## 2015-11-08 LAB — URINALYSIS, ROUTINE W REFLEX MICROSCOPIC
Glucose, UA: NEGATIVE mg/dL
KETONES UR: 15 mg/dL — AB
NITRITE: NEGATIVE
PH: 5.5 (ref 5.0–8.0)
PROTEIN: NEGATIVE mg/dL
Specific Gravity, Urine: 1.02 (ref 1.005–1.030)

## 2015-11-08 LAB — MAGNESIUM: Magnesium: 2.5 mg/dL — ABNORMAL HIGH (ref 1.7–2.4)

## 2015-11-08 LAB — CBG MONITORING, ED: GLUCOSE-CAPILLARY: 99 mg/dL (ref 65–99)

## 2015-11-08 MED ORDER — DEXTROSE 5 % IV SOLN: 1.0000 g | Freq: Once | INTRAVENOUS | Status: DC

## 2015-11-08 MED ORDER — CHLORHEXIDINE GLUCONATE 0.12 % MT SOLN
15.0000 mL | Freq: Two times a day (BID) | OROMUCOSAL | Status: DC
  Administered 2015-11-09 – 2015-11-11 (×5): 15 mL via OROMUCOSAL
  Filled 2015-11-08 (×4): qty 15

## 2015-11-08 MED ORDER — ONDANSETRON HCL 4 MG/2ML IJ SOLN: 4.0000 mg | Freq: Four times a day (QID) | INTRAMUSCULAR | Status: DC | PRN

## 2015-11-08 MED ORDER — SODIUM CHLORIDE 0.9 % IV SOLN
INTRAVENOUS | Status: AC
  Administered 2015-11-08: 10:00:00 via INTRAVENOUS

## 2015-11-08 MED ORDER — ACETAMINOPHEN 325 MG PO TABS: 650.0000 mg | ORAL_TABLET | Freq: Four times a day (QID) | ORAL | Status: DC | PRN

## 2015-11-08 MED ORDER — POTASSIUM CHLORIDE 10 MEQ/100ML IV SOLN
10.0000 meq | INTRAVENOUS | Status: AC
  Administered 2015-11-08 (×4): 10 meq via INTRAVENOUS
  Filled 2015-11-08 (×4): qty 100

## 2015-11-08 MED ORDER — SODIUM CHLORIDE 0.9 % IV BOLUS (SEPSIS)
500.0000 mL | Freq: Once | INTRAVENOUS | Status: AC
  Administered 2015-11-08: 500 mL via INTRAVENOUS

## 2015-11-08 MED ORDER — HEPARIN SODIUM (PORCINE) 5000 UNIT/ML IJ SOLN
5000.0000 [IU] | Freq: Three times a day (TID) | INTRAMUSCULAR | Status: DC
  Administered 2015-11-08 – 2015-11-11 (×10): 5000 [IU] via SUBCUTANEOUS
  Filled 2015-11-08 (×7): qty 1

## 2015-11-08 MED ORDER — BISACODYL 5 MG PO TBEC
5.0000 mg | DELAYED_RELEASE_TABLET | Freq: Every day | ORAL | Status: DC | PRN
  Filled 2015-11-08: qty 1

## 2015-11-08 MED ORDER — FAMOTIDINE IN NACL 20-0.9 MG/50ML-% IV SOLN
20.0000 mg | Freq: Two times a day (BID) | INTRAVENOUS | Status: DC
  Administered 2015-11-08 – 2015-11-11 (×7): 20 mg via INTRAVENOUS
  Filled 2015-11-08 (×8): qty 50

## 2015-11-08 MED ORDER — ONDANSETRON HCL 4 MG PO TABS: 4.0000 mg | ORAL_TABLET | Freq: Four times a day (QID) | ORAL | Status: DC | PRN

## 2015-11-08 MED ORDER — CETYLPYRIDINIUM CHLORIDE 0.05 % MT LIQD
7.0000 mL | Freq: Two times a day (BID) | OROMUCOSAL | Status: DC
  Administered 2015-11-09 – 2015-11-11 (×4): 7 mL via OROMUCOSAL

## 2015-11-08 MED ORDER — ACETAMINOPHEN 650 MG RE SUPP: 650.0000 mg | Freq: Four times a day (QID) | RECTAL | Status: DC | PRN

## 2015-11-08 MED ORDER — POLYETHYLENE GLYCOL 3350 17 G PO PACK: 17.0000 g | PACK | Freq: Every day | ORAL | Status: DC | PRN

## 2015-11-08 MED ORDER — SODIUM CHLORIDE 0.9% FLUSH
3.0000 mL | Freq: Two times a day (BID) | INTRAVENOUS | Status: DC
  Administered 2015-11-11: 3 mL via INTRAVENOUS

## 2015-11-08 MED ORDER — SODIUM CHLORIDE 0.9 % IV SOLN
INTRAVENOUS | Status: DC
  Administered 2015-11-08 (×2): via INTRAVENOUS

## 2015-11-08 MED ORDER — SODIUM CHLORIDE 0.9 % IV SOLN
1.0000 g | Freq: Four times a day (QID) | INTRAVENOUS | Status: DC
  Administered 2015-11-08 – 2015-11-09 (×6): 1 g via INTRAVENOUS
  Filled 2015-11-08 (×11): qty 1000

## 2015-11-08 NOTE — ED Notes (Signed)
[facility] x4 days.  Pt was dx with UTI on 5/29 and has been on abx for aprox. 2 days.  Family refused EMS transport last night and pt was transported this morning.

## 2015-11-08 NOTE — H&P (Signed)
History and Physical    [name] ZOX:[id number] DOB: [birth year] DOA: 11/08/2015  PCP: No PCP Per Patient  Patient coming from:  Nursing  Home    Chief Complaint: not eating or drinking   HPI: [name] is a 82 y.o. female with medical history significant for but not necessarily limited to, dementia, chronic kidney disease , hypertension and GERD. She was seen in ED 11/03/15 with a UTI . Prescribed Keflex. Culture + for > 100,000 enterococcus. Culture reviewed by antimicrobial stewardship pharmacist who felt it was contaminated and no further follow-up required . Sensitivity studies showed bug was sensitive to ampicillin, nitrofurantoin and vancomycin .   History comes from chart and daughter/ son-in-law at bedside. At baseline patient is able to transfer to a wheelchair and feet herself. She has been unable to carry on a conversation in months. Patient usually mumbles, occasionally says some identifiable words  ED Course:  Patient afebrile, hemodynamically stable Trop  0.12 WBC 15.4 Hematocrit 48.5 Sodium 149 Potassium 3.2 BUN 55 / creatinine 2.4 , baseline 1.7 No acute findings on head CT scan  EKG Interpretation  Date/Time:  Saturday November 08 2015 07:12:59 EDT Ventricular Rate:  79 PR Interval:  153 QRS Duration: 124 QT Interval:  428 QTC Calculation: 491 R Axis:   -66 Text Interpretation:  Sinus rhythm IVCD, consider atypical RBBB LVH with IVCD and secondary repol abnrm Probable lateral infarct, age indeterminate Borderline prolonged QT interval changes compared to Nov 03 2015 Confirmed by Criss Alvine MD, SCOTT [id number]) on 11/08/2015 7:15:45 AM       200 cc normal saline bolus, Rocephin, ampicillin  Review of Systems: Unable to obtain, dementia  Past Medical History  Diagnosis Date  . Hypertension   . Cardiac failure (HCC)   . Arthritis   . GERD (gastroesophageal reflux disease)   . Dementia   . CKD (chronic kidney disease), stage IV [facility])    Past Surgical  History  Procedure Laterality Date  . Cholecystectomy       reports that she has never smoked. She does not have any smokeless tobacco history on file. She reports that she does not drink alcohol or use illicit drugs.  No Known Allergies   Family History  Problem Relation Age of Onset  . Diabetes Brother   . Lung cancer Brother     Prior to Admission medications   Medication Sig Start Date End Date Taking? Authorizing Provider  amLODipine (NORVASC) 10 MG tablet Take 10 mg by mouth daily. 04/02/14   Historical Provider, MD  cephALEXin (KEFLEX) 500 MG capsule Take 1 capsule (500 mg total) by mouth 3 (three) times daily. 11/03/15   Dione Booze, MD  cetirizine (ZYRTEC) 10 MG tablet Take 10 mg by mouth daily.    Historical Provider, MD  clonazePAM (KLONOPIN) 0.5 MG tablet Take 0.5 tablets by mouth 2 (two) times daily. 10/08/15   Historical Provider, MD  divalproex (DEPAKOTE SPRINKLE) 125 MG capsule Take 250 mg by mouth 2 (two) times daily.    Historical Provider, MD  furosemide (LASIX) 40 MG tablet Take 40 mg by mouth daily. 04/02/14   Historical Provider, MD  metoprolol (LOPRESSOR) 50 MG tablet Take 25 mg by mouth 2 (two) times daily.  03/17/14   Historical Provider, MD  Multiple Vitamins-Minerals (CENTRUM PO) Take 1 tablet by mouth daily.    Historical Provider, MD  omeprazole (PRILOSEC) 20 MG capsule Take 20 mg by mouth daily. 04/02/14   Historical Provider, MD  traMADol Janean Sark)  50 MG tablet Take 1 tablet by mouth 2 (two) times daily. 10/15/15   Historical Provider, MD    Physical Exam: Filed Vitals:   11/08/15 4098 11/08/15 0714 11/08/15 0745 11/08/15 0803  BP:  110/54  124/65  Pulse:  78  73  Temp:   98.4 F (36.9 C)   TempSrc:   Rectal   Resp:  20  17  Height:   (1.549 m)    Weight:  58.968 kg (130 lb)    SpO2: 98% 96%  98%    Constitutional:  White female lying in bed in NAD, calm, comfortable Filed Vitals:   11/08/15 0713 11/08/15 0714 11/08/15 0745 11/08/15 0803    BP:  110/54  124/65  Pulse:  78  73  Temp:   98.4 F (36.9 C)   TempSrc:   Rectal   Resp:  20  17  Height:   (1.549 m)    Weight:  58.968 kg (130 lb)    SpO2: 98% 96%  98%   Eyes: Pupils pinpoint, equal, normal lids and conjunctivae normal ENMT: Mucous membranes dry. Remnants of pill in mouth.are moist. Posterior pharynx clear of any exudate or lesions.Normal dentition.  Neck: normal, supple, no masses Respiratory: clear to auscultation bilaterally, no wheezing, no crackles. Normal respiratory effort. No accessory muscle use.  Cardiovascular: Regular rate and rhythm, murmur present and heard over right second intercostal. No extremity edema. 2+ pedal pulses. No carotid bruits.  Abdomen: no tenderness, no masses palpated. No hepatomegaly. Bowel sounds positive.  Musculoskeletal: no clubbing / cyanosis. Will not fully extend either arm on passive ROM.  Normal muscle tone.  Skin: no rashes, lesions, ulcers.  Neurologic: Awake. Nonverbal. Will not follow commands. Grimaces to uncomfortable stimuli Psychiatric: Normal judgment and insight. Alert and oriented x 3. Normal mood.   Labs on Admission: I have personally reviewed following labs and imaging studies   Urine analysis:    Component Value Date/Time   COLORURINE AMBER* 11/08/2015 0740   APPEARANCEUR CLOUDY* 11/08/2015 0740   LABSPEC 1.020 11/08/2015 0740   PHURINE 5.5 11/08/2015 0740   GLUCOSEU NEGATIVE 11/08/2015 0740   HGBUR SMALL* 11/08/2015 0740   BILIRUBINUR SMALL* 11/08/2015 0740   KETONESUR 15* 11/08/2015 0740   PROTEINUR NEGATIVE 11/08/2015 0740   UROBILINOGEN 0.2 12/09/2014 1940   NITRITE NEGATIVE 11/08/2015 0740   LEUKOCYTESUR LARGE* 11/08/2015 0740    Radiological Exams on Admission: Ct Head Wo Contrast  11/08/2015  CLINICAL DATA:  Altered mental status EXAM: CT HEAD WITHOUT CONTRAST TECHNIQUE: Contiguous axial images were obtained from the base of the skull through the vertex without intravenous contrast.  COMPARISON:  04/27/2014 FINDINGS: There is mild diffuse low-attenuation within the subcortical and periventricular white matter compatible with chronic microvascular disease. This demonstrates significant interval progression when compared with previous exam. Chronic left thalamus lacunar infarct noted. Chronic left cerebellar hemisphere infarct noted. Ex vacuo dilatation of the ventricles and sulci did noted which also appears progressive from the previous exam. No abnormal extra-axial fluid collection, intracranial hemorrhage or mass identified. IMPRESSION: 1. Progressive small vessel ischemic disease and brain atrophy. 2. No acute intracranial abnormalities. Electronically Signed   By: Signa Kell M.D.   On: 11/08/2015 07:59    EKG: Independently reviewed.   EKG Interpretation  Date/Time:  Saturday November 08 2015 07:12:59 EDT Ventricular Rate:  79 PR Interval:  153 QRS Duration: 124 QT Interval:  428 QTC Calculation: 491 R Axis:   -66 Text Interpretation:  Sinus rhythm  IVCD, consider atypical RBBB LVH with IVCD and secondary repol abnrm Probable lateral infarct, age indeterminate Borderline prolonged QT interval changes compared to Nov 03 2015 Confirmed by Criss AlvineGOLDSTON MD, SCOTT [phone]([id number]) on 11/08/2015 7:15:45 AM        Assessment/Plan   Active Problems:   Altered mental status   Encephalopathy   Hypokalemia   Hypernatremia   GERD (gastroesophageal reflux disease)   Dementia   Volume depletion   Encephalopathy, possibly secondary to recurrent vrs. persistent UTI. Patient has underlying dementia but not eating or drinking at nursing home over last few days. Head CT without acute changes. Appears volume depleted based on exam and labs.             -Admit to medical bed-telemetry -IV ampicillin.  Recent ED visit - sent home from ED with Keflex for UTI. C&S report shows sensitivity to ampicillin . Antibiotic Stewardship Pharmacist followed up on culture, didn't feel further treatment  necessary.  -Volume repletion -Follow-up on the urine culture -SLP swallow screen  (dissolved pills still in her mouth) tomorrow if mentation improves  Volume depletion (HCT 48%) / AKI superimposed on chronic kidney disease stage IV  -IV volume repletion -am BMET -hold home lasix -Avoid nephrotoxic medications  hypokalemia. -Will replete intravenously with 4 runs of 10mEq (this is conservative amount given she renal function) -Obtain magnesium level -holding lasix -f/u am BMET  Hypertension. BP on low side in ED.  -Hold BP meds until taking PO  -Prn IV hydralazine  Behavioral disturbances?  Has Dementia, reason for Depakote?.  -hold Depakote until tolerating POs -Depakote level  Elevated troponin, likely related to AKI/ CKD. No acute EKG changes -Monitor on telemetry -cycle  troponins   Hypernatremia, likely secondary to volume depletion.   . -Continue IV fluid resuscitation -Follow-up labs in a.m.  GERD. - IV H2 until taking by mouth again ( PPI med shortage)  DVT prophylaxis:   Lovenox, reduced dose Code Status:     DNR Family Communication:   Spoke with daughter and son-in -law in room and they understand and agree with plan of treatment.  Disposition Plan: Discharge  back to nursing home in 24-48 hours              Consults called:   None  Admission status:  Observation - Medical bed / telemetry  Admission -  Medical bed  Telemetry    Willette ClusterPaula Guenther NP [facility] Pager [phone]  If 7PM-7AM, please contact night-coverage [email or web address] Password [email or web address]  11/08/2015, 10:01 AM

## 2015-11-08 NOTE — ED Notes (Signed)
CBG- 99 

## 2015-11-08 NOTE — ED Provider Notes (Signed)
CSN: [id number]     Arrival date & time 11/08/15  0705 History   First MD Initiated Contact with Patient 11/08/15 [phone]     No chief complaint on file.    (Consider location/radiation/quality/duration/timing/severity/associated sxs/prior Treatment) HPI  82 year old female presents from nursing home with altered mental status. Per EMS, they were called on her yesterday and she is the same. At that time family did not want her transported yesterday, were going to check on her today. Before their arrival EMS was called. Has been altered since 5/29 when she was diagnosed with a UTI and given keflex.  I talked to facility and they state she has been more lethargic and is not eating/drinking over last 2 days. Vomits whenever she does try to eat.  Past Medical History  Diagnosis Date  . Hypertension    No past surgical history on file. No family history on file. Social History  Substance Use Topics  . Smoking status: Never Smoker   . Smokeless tobacco: Not on file  . Alcohol Use: No   OB History    No data available     Review of Systems  Unable to perform ROS: Mental status change      Allergies  Review of patient's allergies indicates no known allergies.  Home Medications   Prior to Admission medications   Medication Sig Start Date End Date Taking? Authorizing Provider  amLODipine (NORVASC) 10 MG tablet Take 10 mg by mouth daily. 04/02/14   Historical Provider, MD  cephALEXin (KEFLEX) 500 MG capsule Take 1 capsule (500 mg total) by mouth 3 (three) times daily. 11/03/15   Dione Booze, MD  cetirizine (ZYRTEC) 10 MG tablet Take 10 mg by mouth daily.    Historical Provider, MD  clonazePAM (KLONOPIN) 0.5 MG tablet Take 0.5 tablets by mouth 2 (two) times daily. 10/08/15   Historical Provider, MD  divalproex (DEPAKOTE SPRINKLE) 125 MG capsule Take 250 mg by mouth 2 (two) times daily.    Historical Provider, MD  furosemide (LASIX) 40 MG tablet Take 40 mg by mouth daily. 04/02/14    Historical Provider, MD  metoprolol (LOPRESSOR) 50 MG tablet Take 25 mg by mouth 2 (two) times daily.  03/17/14   Historical Provider, MD  Multiple Vitamins-Minerals (CENTRUM PO) Take 1 tablet by mouth daily.    Historical Provider, MD  omeprazole (PRILOSEC) 20 MG capsule Take 20 mg by mouth daily. 04/02/14   Historical Provider, MD  traMADol (ULTRAM) 50 MG tablet Take 1 tablet by mouth 2 (two) times daily. 10/15/15   Historical Provider, MD   BP 124/65 mmHg  Pulse 73  Temp(Src) 98.4 F (36.9 C) (Rectal)  Resp 17  Ht 5\' 1"  (1.549 m)  Wt 130 lb (58.968 kg)  BMI 24.58 kg/m2  SpO2 98% Physical Exam  Constitutional: She appears well-developed and well-nourished.  Mouth held agape. When I place a tongue depressor in her mouth she instantly lifts her tongue towards roof of her mouth  HENT:  Head: Normocephalic and atraumatic.  Right Ear: External ear normal.  Left Ear: External ear normal.  Nose: Nose normal.  Eyes: Pupils are equal, round, and reactive to light. Right eye exhibits no discharge. Left eye exhibits no discharge.  Cardiovascular: Normal rate, regular rhythm and normal heart sounds.   Pulmonary/Chest: Effort normal and breath sounds normal.  Abdominal: Soft. There is no tenderness.  Neurological: She is alert.  Awake, tracks. Does not follow commands. Slowly lets arms and legs get back down  to bed, can hold in air for a few seconds  Skin: Skin is warm and dry.  Nursing note and vitals reviewed.   ED Course  Procedures (including critical care time) Labs Review Labs Reviewed  COMPREHENSIVE METABOLIC PANEL - Abnormal; Notable for the following:    Sodium 149 (*)    Potassium 3.2 (*)    Glucose, Bld 101 (*)    BUN 55 (*)    Creatinine, Ser 2.40 (*)    Calcium 8.8 (*)    Albumin 3.1 (*)    ALT 11 (*)    GFR calc non Af Amer 17 (*)    GFR calc Af Amer 19 (*)    All other components within normal limits  CBC WITH DIFFERENTIAL/PLATELET - Abnormal; Notable for the  following:    WBC 15.4 (*)    RBC 5.42 (*)    Hemoglobin 16.2 (*)    HCT 48.5 (*)    Neutro Abs 11.9 (*)    Monocytes Absolute 1.2 (*)    All other components within normal limits  TROPONIN I - Abnormal; Notable for the following:    Troponin I 0.12 (*)    All other components within normal limits  URINE CULTURE  URINALYSIS, ROUTINE W REFLEX MICROSCOPIC (NOT AT [facility])  CBG MONITORING, ED    Imaging Review Ct Head Wo Contrast  11/08/2015  CLINICAL DATA:  Altered mental status EXAM: CT HEAD WITHOUT CONTRAST TECHNIQUE: Contiguous axial images were obtained from the base of the skull through the vertex without intravenous contrast. COMPARISON:  04/27/2014 FINDINGS: There is mild diffuse low-attenuation within the subcortical and periventricular white matter compatible with chronic microvascular disease. This demonstrates significant interval progression when compared with previous exam. Chronic left thalamus lacunar infarct noted. Chronic left cerebellar hemisphere infarct noted. Ex vacuo dilatation of the ventricles and sulci did noted which also appears progressive from the previous exam. No abnormal extra-axial fluid collection, intracranial hemorrhage or mass identified. IMPRESSION: 1. Progressive small vessel ischemic disease and brain atrophy. 2. No acute intracranial abnormalities. Electronically Signed   By: Signa Kellaylor  Stroud M.D.   On: 11/08/2015 07:59   I have personally reviewed and evaluated these images and lab results as part of my medical decision-making.   EKG Interpretation   Date/Time:  Saturday November 08 2015 07:12:59 EDT Ventricular Rate:  79 PR Interval:  153 QRS Duration: 124 QT Interval:  428 QTC Calculation: 491 R Axis:   -66 Text Interpretation:  Sinus rhythm IVCD, consider atypical RBBB LVH with  IVCD and secondary repol abnrm Probable lateral infarct, age indeterminate  Borderline prolonged QT interval changes compared to Nov 03 2015 Confirmed  by Criss AlvineGOLDSTON MD, Tyneshia Stivers  [phone]) on 11/08/2015 7:15:45 AM      MDM   Final diagnoses:  Somnolence  UTI (urinary tract infection) due to Enterococcus  Acute kidney injury (HCC)    Patient's urine culture grew enterococcus. Due to this, Keflex is likely not as effective as it needs to be. Patient will be given IV ampicillin. Her labs show worsening kidney function that is likely from poor intake. She is hemodynamically stable here but significantly changed from baseline per family at the bedside. Normally she is awake and alert and eats frequently. Currently she is much more lethargic. Family would like for her to be DO NOT RESUSCITATE. She will be given IV fluids, IV antibiotics, and discussed with family that if patient does not improve her mental status after trial of fluids and antibiotics then a  discussion will need to be had about further goals. Admit to hospitalist, Dr. Heide Guile, MD 11/08/15 [phone]

## 2015-11-08 NOTE — Assessment & Plan Note (Signed)
This may be why she is on Depakote?

## 2015-11-09 DIAGNOSIS — F0391 Unspecified dementia with behavioral disturbance: Secondary | ICD-10-CM

## 2015-11-09 DIAGNOSIS — N179 Acute kidney failure, unspecified: Principal | ICD-10-CM

## 2015-11-09 DIAGNOSIS — G9340 Encephalopathy, unspecified: Secondary | ICD-10-CM

## 2015-11-09 DIAGNOSIS — E870 Hyperosmolality and hypernatremia: Secondary | ICD-10-CM

## 2015-11-09 DIAGNOSIS — L8990 Pressure ulcer of unspecified site, unspecified stage: Secondary | ICD-10-CM | POA: Insufficient documentation

## 2015-11-09 LAB — BASIC METABOLIC PANEL
ANION GAP: 10 (ref 5–15)
BUN: 44 mg/dL — ABNORMAL HIGH (ref 6–20)
CALCIUM: 8.4 mg/dL — AB (ref 8.9–10.3)
CHLORIDE: 119 mmol/L — AB (ref 101–111)
CO2: 25 mmol/L (ref 22–32)
CREATININE: 1.91 mg/dL — AB (ref 0.44–1.00)
GFR calc Af Amer: 26 mL/min — ABNORMAL LOW (ref >60)
GFR, EST NON AFRICAN AMERICAN: 22 mL/min — AB (ref >60)
GLUCOSE: 55 mg/dL — AB (ref 65–99)
POTASSIUM: 4.4 mmol/L (ref 3.5–5.1)
Sodium: 154 mmol/L — ABNORMAL HIGH (ref 135–145)

## 2015-11-09 LAB — CBC
HCT: 48.8 % — ABNORMAL HIGH (ref 36.0–46.0)
Hemoglobin: 15.5 g/dL — ABNORMAL HIGH (ref 12.0–15.0)
MCH: 29.4 pg (ref 26.0–34.0)
MCHC: 31.8 g/dL (ref 30.0–36.0)
MCV: 92.4 fL (ref 78.0–100.0)
PLATELETS: 178 10*3/uL (ref 150–400)
RBC: 5.28 MIL/uL — AB (ref 3.87–5.11)
RDW: 15.6 % — AB (ref 11.5–15.5)
WBC: 12.8 10*3/uL — AB (ref 4.0–10.5)

## 2015-11-09 LAB — GLUCOSE, CAPILLARY
GLUCOSE-CAPILLARY: 82 mg/dL (ref 65–99)
GLUCOSE-CAPILLARY: 84 mg/dL (ref 65–99)
Glucose-Capillary: 52 mg/dL — ABNORMAL LOW (ref 65–99)
Glucose-Capillary: 95 mg/dL (ref 65–99)
Glucose-Capillary: 84 mg/dL (ref 65–99)

## 2015-11-09 LAB — URINE CULTURE: Culture: 6000 — AB

## 2015-11-09 MED ORDER — KCL IN DEXTROSE-NACL 10-5-0.45 MEQ/L-%-% IV SOLN: INTRAVENOUS | Status: DC

## 2015-11-09 MED ORDER — DEXTROSE 5 % IV SOLN
INTRAVENOUS | Status: DC
  Administered 2015-11-09 – 2015-11-10 (×3): via INTRAVENOUS

## 2015-11-09 MED ORDER — DEXTROSE 50 % IV SOLN
INTRAVENOUS | Status: AC
  Administered 2015-11-09: 25 mL
  Filled 2015-11-09: qty 50

## 2015-11-09 NOTE — Progress Notes (Addendum)
PROGRESS NOTE  [name]  JXB:[id number] DOB: Jun 23, 1923 DOA: 11/08/2015 PCP: No PCP Per Patient  Brief Narrative:   Chloe EyeMary V Mills is a 82 y.o. female with dementia, chronic kidney disease, hypertension and GERD. She was seen in ED 11/03/15 with a UTI . Prescribed Keflex. Culture + for > 100,000 enterococcus. Culture reviewed by antimicrobial stewardship pharmacist who felt it was contaminated and no further follow-up required.  After being sent back to her ALF, she became increasingly lethargic and refused to eat and drink.  Returned to hospital and found to have dehydration with AKI.    Assessment & Plan:   Active Problems:   Altered mental status   Encephalopathy   Hypokalemia   Hypernatremia   GERD (gastroesophageal reflux disease)   Dementia   Volume depletion   Acute kidney injury (HCC)   Somnolence   UTI (urinary tract infection) due to Enterococcus   Pressure ulcer  Metabolic encephalopathy, possibly secondary to dehydration.  Patient has underlying dementia.  - d/c IV ampicillin. - urine culture with insignificant growth - continue correction of hypernatremia, dehydration - defer SLP swallow screen > still too somnolent  Volume depletion (HCT 48%) / AKI superimposed on chronic kidney disease stage IV, creatinine near baseline - continue IVF  Hypernatremia -  Change to D5W -  Repeat BMP in AM  Hypokalemia, resolved with IV supplementation  Hypertension. Hypotensive -Hold BP meds   Behavioral disturbances? Has Dementia, reason for Depakote?.  -hold Depakote until tolerating POs -Depakote level < 10  Elevated troponin, likely related to AKI/ CKD. No acute EKG changes -Telemetry:  SR -cycleone more troponin.  If dramatically elevated, will get ECHO.  - anticipate medical management  GERD. - IV H2 until taking by mouth again ( PPI med shortage)  Stage 1 pressure ulcer on sacrum.  Allevyn.    DVT prophylaxis:  heparin Code Status:   DNR Family Communication:  Patient, her daughter and grandson.  Discussed GOC and currently would like to pursue continuing IVF and antibiotics but considering comfort measures only.  Would not want feeding tube. Disposition Plan:   Likely to SNF after discharge unless family elects hospice and patient does not improve   Consultants:   none  Procedures:  none  Antimicrobials:   Ampicillin 6/3 >    Subjective:  Has a headache.  Denies chest pain, difficult breathing.  Her belly hurts some too.    Objective: Filed Vitals:   11/08/15 1726 11/08/15 2230 11/09/15 0512 11/09/15 0841  BP: 130/46 121/56 140/51 105/39  Pulse: 66 68 76 65  Temp: 98.5 F (36.9 C) 98.2 F (36.8 C) 98.1 F (36.7 C) 98.4 F (36.9 C)  TempSrc: Axillary Axillary Axillary Axillary  Resp: 17 16 16 14   Height:      Weight:      SpO2: 95% 97% 95% 99%    Intake/Output Summary (Last 24 hours) at 11/09/15 1643 Last data filed at 11/09/15 1324  Gross per 24 hour  Intake 1766.25 ml  Output      2 ml  Net 1764.25 ml   Filed Weights   11/08/15 0714 11/08/15 1155  Weight: 58.968 kg (130 lb) 54.658 kg (120 lb 8 oz)    Examination:  General exam:  Thin Adult female.  No acute distress.  HEENT:  NCAT, dry mouth with dried secretions Respiratory system: Clear to auscultation bilaterally Cardiovascular system: Regular rate and rhythm, normal S1/S2. No murmurs, rubs, gallops or clicks.  Warm extremities Gastrointestinal  system: Normal active bowel sounds, soft, nondistended, mildly TTP diffusely without rebound or guarding. MSK:  Decreased tone and bulk, no lower extremity edema Neuro:  Grossly moves all extremities    Data Reviewed: I have personally reviewed following labs and imaging studies  CBC:  Recent Labs Lab 11/03/15 0531 11/08/15 0737 11/09/15 0459  WBC 9.1 15.4* 12.8*  NEUTROABS 4.6 11.9*  --   HGB 14.4 16.2* 15.5*  HCT 42.4 48.5* 48.8*  MCV 87.8 89.5 92.4  PLT 202 233 178    Basic Metabolic Panel:  Recent Labs Lab 11/03/15 0531 11/08/15 0737 11/08/15 1104 11/09/15 0459  NA 143 149*  --  154*  K 3.4* 3.2*  --  4.4  CL 110 111  --  119*  CO2 24 24  --  25  GLUCOSE 84 101*  --  55*  BUN 37* 55*  --  44*  CREATININE 2.03* 2.40*  --  1.91*  CALCIUM 8.5* 8.8*  --  8.4*  MG  --   --  2.5*  --    GFR: Estimated Creatinine Clearance: 14.8 mL/min (by C-G formula based on Cr of 1.91). Liver Function Tests:  Recent Labs Lab 11/03/15 0531 11/08/15 0737  AST 19 18  ALT 10* 11*  ALKPHOS 72 92  BILITOT 1.0 1.0  PROT 6.3* 6.9  ALBUMIN 3.4* 3.1*   No results for input(s): LIPASE, AMYLASE in the last 168 hours. No results for input(s): AMMONIA in the last 168 hours. Coagulation Profile: No results for input(s): INR, PROTIME in the last 168 hours. Cardiac Enzymes:  Recent Labs Lab 11/08/15 0737 11/08/15 1104 11/08/15 1628  TROPONINI 0.12* 0.15* 0.22*   BNP (last 3 results) No results for input(s): PROBNP in the last 8760 hours. HbA1C: No results for input(s): HGBA1C in the last 72 hours. CBG:  Recent Labs Lab 11/08/15 0718 11/09/15 0733 11/09/15 0812 11/09/15 1127 11/09/15 1544  GLUCAP 99 52* 95 84 82   Lipid Profile: No results for input(s): CHOL, HDL, LDLCALC, TRIG, CHOLHDL, LDLDIRECT in the last 72 hours. Thyroid Function Tests: No results for input(s): TSH, T4TOTAL, FREET4, T3FREE, THYROIDAB in the last 72 hours. Anemia Panel: No results for input(s): VITAMINB12, FOLATE, FERRITIN, TIBC, IRON, RETICCTPCT in the last 72 hours. Urine analysis:    Component Value Date/Time   COLORURINE AMBER* 11/08/2015 0740   APPEARANCEUR CLOUDY* 11/08/2015 0740   LABSPEC 1.020 11/08/2015 0740   PHURINE 5.5 11/08/2015 0740   GLUCOSEU NEGATIVE 11/08/2015 0740   HGBUR SMALL* 11/08/2015 0740   BILIRUBINUR SMALL* 11/08/2015 0740   KETONESUR 15* 11/08/2015 0740   PROTEINUR NEGATIVE 11/08/2015 0740   UROBILINOGEN 0.2 12/09/2014 1940   NITRITE  NEGATIVE 11/08/2015 0740   LEUKOCYTESUR LARGE* 11/08/2015 0740   Sepsis Labs: (procalcitonin:4,lacticidven:4)  ) Recent Results (from the past 240 hour(s))  Urine culture     Status: Abnormal   Collection Time: 11/03/15  6:20 AM  Result Value Ref Range Status   Specimen Description URINE, CATHETERIZED  Final   Special Requests NONE  Final   Culture >=100,000 COLONIES/mL ENTEROCOCCUS SPECIES (A)  Final   Report Status 11/05/2015 FINAL  Final   Organism ID, Bacteria ENTEROCOCCUS SPECIES (A)  Final      Susceptibility   Enterococcus species - MIC*    AMPICILLIN <=2 SENSITIVE Sensitive     LEVOFLOXACIN >=8 RESISTANT Resistant     NITROFURANTOIN <=16 SENSITIVE Sensitive     VANCOMYCIN 1 SENSITIVE Sensitive     * >=100,000 COLONIES/mL ENTEROCOCCUS  SPECIES  Urine culture     Status: Abnormal   Collection Time: 11/08/15  7:40 AM  Result Value Ref Range Status   Specimen Description URINE, CATHETERIZED  Final   Special Requests NONE  Final   Culture 6,000 COLONIES/mL INSIGNIFICANT GROWTH (A)  Final   Report Status 11/09/2015 FINAL  Final  Culture, blood (routine x 2)     Status: None (Preliminary result)   Collection Time: 11/08/15  4:25 PM  Result Value Ref Range Status   Specimen Description BLOOD BLOOD LEFT ARM  Final   Special Requests BOTTLES DRAWN AEROBIC ONLY 5CC  Final   Culture NO GROWTH < 24 HOURS  Final   Report Status PENDING  Incomplete  Culture, blood (routine x 2)     Status: None (Preliminary result)   Collection Time: 11/08/15  4:31 PM  Result Value Ref Range Status   Specimen Description BLOOD LEFT WRIST  Final   Special Requests BOTTLES DRAWN AEROBIC ONLY 5CC  Final   Culture NO GROWTH < 24 HOURS  Final   Report Status PENDING  Incomplete      Radiology Studies: Ct Head Wo Contrast  11/08/2015  CLINICAL DATA:  Altered mental status EXAM: CT HEAD WITHOUT CONTRAST TECHNIQUE: Contiguous axial images were obtained from the base of the skull through the  vertex without intravenous contrast. COMPARISON:  04/27/2014 FINDINGS: There is mild diffuse low-attenuation within the subcortical and periventricular white matter compatible with chronic microvascular disease. This demonstrates significant interval progression when compared with previous exam. Chronic left thalamus lacunar infarct noted. Chronic left cerebellar hemisphere infarct noted. Ex vacuo dilatation of the ventricles and sulci did noted which also appears progressive from the previous exam. No abnormal extra-axial fluid collection, intracranial hemorrhage or mass identified. IMPRESSION: 1. Progressive small vessel ischemic disease and brain atrophy. 2. No acute intracranial abnormalities. Electronically Signed   By: Signa Kell M.D.   On: 11/08/2015 07:59     Scheduled Meds: . ampicillin (OMNIPEN) IV  1 g Intravenous Q6H  . antiseptic oral rinse  7 mL Mouth Rinse q12n4p  . chlorhexidine  15 mL Mouth Rinse BID  . famotidine (PEPCID) IV  20 mg Intravenous Q12H  . heparin  5,000 Units Subcutaneous Q8H  . sodium chloride flush  3 mL Intravenous Q12H   Continuous Infusions: . dextrose 75 mL/hr at 11/09/15 0745     LOS: 1 day    Time spent: 30 min    Nizar Cutler, Thea Silversmith, MD [facility] Pager [phone]  If 7PM-7AM, please contact night-coverage [email or web address] Password [facility] 11/09/2015, 4:43 PM

## 2015-11-10 DIAGNOSIS — E869 Volume depletion, unspecified: Secondary | ICD-10-CM

## 2015-11-10 LAB — BASIC METABOLIC PANEL
ANION GAP: 9 (ref 5–15)
BUN: 31 mg/dL — ABNORMAL HIGH (ref 6–20)
CHLORIDE: 118 mmol/L — AB (ref 101–111)
CO2: 21 mmol/L — AB (ref 22–32)
Calcium: 8.1 mg/dL — ABNORMAL LOW (ref 8.9–10.3)
Creatinine, Ser: 1.58 mg/dL — ABNORMAL HIGH (ref 0.44–1.00)
GFR calc non Af Amer: 28 mL/min — ABNORMAL LOW (ref >60)
GFR, EST AFRICAN AMERICAN: 32 mL/min — AB (ref >60)
Glucose, Bld: 97 mg/dL (ref 65–99)
POTASSIUM: 3.5 mmol/L (ref 3.5–5.1)
Sodium: 148 mmol/L — ABNORMAL HIGH (ref 135–145)

## 2015-11-10 LAB — CBC
HCT: 43.2 % (ref 36.0–46.0)
HEMOGLOBIN: 14.3 g/dL (ref 12.0–15.0)
MCH: 29.5 pg (ref 26.0–34.0)
MCHC: 33.1 g/dL (ref 30.0–36.0)
MCV: 89.1 fL (ref 78.0–100.0)
Platelets: 180 10*3/uL (ref 150–400)
RBC: 4.85 MIL/uL (ref 3.87–5.11)
RDW: 15 % (ref 11.5–15.5)
WBC: 9.5 10*3/uL (ref 4.0–10.5)

## 2015-11-10 LAB — TROPONIN I: Troponin I: 0.06 ng/mL — ABNORMAL HIGH (ref <0.031)

## 2015-11-10 MED ORDER — POTASSIUM CHLORIDE 20 MEQ/15ML (10%) PO SOLN: 20.0000 meq | Freq: Every day | ORAL | Status: DC

## 2015-11-10 MED ORDER — ENSURE ENLIVE PO LIQD
237.0000 mL | Freq: Two times a day (BID) | ORAL | Status: DC
  Administered 2015-11-11 (×2): 237 mL via ORAL

## 2015-11-10 MED ORDER — POTASSIUM CL IN DEXTROSE 5% 20 MEQ/L IV SOLN
20.0000 meq | INTRAVENOUS | Status: DC
  Administered 2015-11-10: 20 meq via INTRAVENOUS
  Filled 2015-11-10 (×3): qty 1000

## 2015-11-10 MED ORDER — DIVALPROEX SODIUM 125 MG PO CSDR
250.0000 mg | DELAYED_RELEASE_CAPSULE | Freq: Two times a day (BID) | ORAL | Status: DC
  Administered 2015-11-10 – 2015-11-11 (×2): 250 mg via ORAL
  Filled 2015-11-10 (×3): qty 2

## 2015-11-10 NOTE — Progress Notes (Signed)
PROGRESS NOTE  [name]  ZOX:[id number] DOB: 1923-08-10 DOA: 11/08/2015 PCP: No PCP Per Patient  Brief Narrative:   [name] is a 82 y.o. female with dementia, chronic kidney disease, hypertension and GERD. She was seen in ED 11/03/15 with a UTI . Prescribed Keflex. Culture + for > 100,000 enterococcus. Culture reviewed by antimicrobial stewardship pharmacist who felt it was contaminated and no further follow-up required.  After being sent back to her ALF, she became increasingly lethargic and refused to eat and drink.  Returned to hospital and found to have dehydration with AKI.    Assessment & Plan:   Active Problems:   Altered mental status   Encephalopathy   Hypokalemia   Hypernatremia   GERD (gastroesophageal reflux disease)   Dementia   Volume depletion   Acute kidney injury (HCC)   Somnolence   UTI (urinary tract infection) due to Enterococcus   Pressure ulcer  Metabolic encephalopathy, possibly secondary to dehydration.  Patient has underlying dementia.  - continue correction of hypernatremia, dehydration - SLP swallow screen today > passed  Volume depletion (HCT 48%) / AKI superimposed on chronic kidney disease stage IV, creatinine near baseline - continue IVF  Hypernatremia, improving  -  Continue D5W -  Repeat BMP in AM  Hypokalemia, resolved with IV supplementation  Hypertension. Hypotensive -Hold BP meds   Behavioral disturbances - resume Depakote -Depakote level < 10 - consider adding back prn clonazepam if necessary  Elevated troponin, likely related to AKI/ CKD. No acute EKG changes -Telemetry:  SR -cycleone more troponin.  If dramatically elevated, will get ECHO.  - anticipate medical management  GERD. - IV H2 until taking by mouth again ( PPI med shortage)  Stage 1 pressure ulcer on sacrum.  Allevyn.    DVT prophylaxis:  heparin Code Status:  DNR Family Communication:  Patient, her daughter and grandson.  Discussed GOC  and currently would like to pursue continuing IVF and antibiotics but considering comfort measures only.  Would not want feeding tube. Disposition Plan:   Likely to SNF after discharge unless family elects hospice and patient does not improve   Consultants:   none  Procedures:  none  Antimicrobials:   Ampicillin 6/3 >    Subjective:  Unable to communicate today  Objective: Filed Vitals:   11/09/15 2025 11/10/15 0610 11/10/15 0815 11/10/15 1741  BP: 143/57 117/41  101/63  Pulse: 68  67 70  Temp: 97.5 F (36.4 C) 98.4 F (36.9 C) 98.3 F (36.8 C) 97.9 F (36.6 C)  TempSrc: Axillary Axillary Oral Oral  Resp: 18 16 18 17   Height:      Weight:      SpO2: 100% 94% 96% 99%    Intake/Output Summary (Last 24 hours) at 11/10/15 1952 Last data filed at 11/10/15 1843  Gross per 24 hour  Intake 1768.75 ml  Output      0 ml  Net 1768.75 ml   Filed Weights   11/08/15 0714 11/08/15 1155  Weight: 58.968 kg (130 lb) 54.658 kg (120 lb 8 oz)    Examination:  General exam:  Thin Adult female.  No acute distress.  Awake, calling out, more alert than yesterday HEENT:  NCAT, dry mouth with dried secretions Respiratory system: Clear to auscultation bilaterally Cardiovascular system: Regular rate and rhythm, normal S1/S2. No murmurs, rubs, gallops or clicks.  Warm extremities Gastrointestinal system: Normal active bowel sounds, soft, nondistended, mildly TTP diffusely without rebound or guarding. MSK:  Decreased  tone and bulk, no lower extremity edema Neuro:  Grossly moves all extremities    Data Reviewed: I have personally reviewed following labs and imaging studies  CBC:  Recent Labs Lab 11/08/15 0737 11/09/15 0459 11/10/15 0405  WBC 15.4* 12.8* 9.5  NEUTROABS 11.9*  --   --   HGB 16.2* 15.5* 14.3  HCT 48.5* 48.8* 43.2  MCV 89.5 92.4 89.1  PLT 233 178 180   Basic Metabolic Panel:  Recent Labs Lab 11/08/15 0737 11/08/15 1104 11/09/15 0459 11/10/15 0405  NA  149*  --  154* 148*  K 3.2*  --  4.4 3.5  CL 111  --  119* 118*  CO2 24  --  25 21*  GLUCOSE 101*  --  55* 97  BUN 55*  --  44* 31*  CREATININE 2.40*  --  1.91* 1.58*  CALCIUM 8.8*  --  8.4* 8.1*  MG  --  2.5*  --   --    GFR: Estimated Creatinine Clearance: 17.9 mL/min (by C-G formula based on Cr of 1.58). Liver Function Tests:  Recent Labs Lab 11/08/15 0737  AST 18  ALT 11*  ALKPHOS 92  BILITOT 1.0  PROT 6.9  ALBUMIN 3.1*   No results for input(s): LIPASE, AMYLASE in the last 168 hours. No results for input(s): AMMONIA in the last 168 hours. Coagulation Profile: No results for input(s): INR, PROTIME in the last 168 hours. Cardiac Enzymes:  Recent Labs Lab 11/08/15 0737 11/08/15 1104 11/08/15 1628 11/10/15 0405  TROPONINI 0.12* 0.15* 0.22* 0.06*   BNP (last 3 results) No results for input(s): PROBNP in the last 8760 hours. HbA1C: No results for input(s): HGBA1C in the last 72 hours. CBG:  Recent Labs Lab 11/09/15 0733 11/09/15 0812 11/09/15 1127 11/09/15 1544 11/09/15 2017  GLUCAP 52* 95 84 82 84   Lipid Profile: No results for input(s): CHOL, HDL, LDLCALC, TRIG, CHOLHDL, LDLDIRECT in the last 72 hours. Thyroid Function Tests: No results for input(s): TSH, T4TOTAL, FREET4, T3FREE, THYROIDAB in the last 72 hours. Anemia Panel: No results for input(s): VITAMINB12, FOLATE, FERRITIN, TIBC, IRON, RETICCTPCT in the last 72 hours. Urine analysis:    Component Value Date/Time   COLORURINE AMBER* 11/08/2015 0740   APPEARANCEUR CLOUDY* 11/08/2015 0740   LABSPEC 1.020 11/08/2015 0740   PHURINE 5.5 11/08/2015 0740   GLUCOSEU NEGATIVE 11/08/2015 0740   HGBUR SMALL* 11/08/2015 0740   BILIRUBINUR SMALL* 11/08/2015 0740   KETONESUR 15* 11/08/2015 0740   PROTEINUR NEGATIVE 11/08/2015 0740   UROBILINOGEN 0.2 12/09/2014 1940   NITRITE NEGATIVE 11/08/2015 0740   LEUKOCYTESUR LARGE* 11/08/2015 0740   Sepsis  Labs: (procalcitonin:4,lacticidven:4)  ) Recent Results (from the past 240 hour(s))  Urine culture     Status: Abnormal   Collection Time: 11/03/15  6:20 AM  Result Value Ref Range Status   Specimen Description URINE, CATHETERIZED  Final   Special Requests NONE  Final   Culture >=100,000 COLONIES/mL ENTEROCOCCUS SPECIES (A)  Final   Report Status 11/05/2015 FINAL  Final   Organism ID, Bacteria ENTEROCOCCUS SPECIES (A)  Final      Susceptibility   Enterococcus species - MIC*    AMPICILLIN <=2 SENSITIVE Sensitive     LEVOFLOXACIN >=8 RESISTANT Resistant     NITROFURANTOIN <=16 SENSITIVE Sensitive     VANCOMYCIN 1 SENSITIVE Sensitive     * >=100,000 COLONIES/mL ENTEROCOCCUS SPECIES  Urine culture     Status: Abnormal   Collection Time: 11/08/15  7:40 AM  Result  Value Ref Range Status   Specimen Description URINE, CATHETERIZED  Final   Special Requests NONE  Final   Culture 6,000 COLONIES/mL INSIGNIFICANT GROWTH (A)  Final   Report Status 11/09/2015 FINAL  Final  Culture, blood (routine x 2)     Status: None (Preliminary result)   Collection Time: 11/08/15  4:25 PM  Result Value Ref Range Status   Specimen Description BLOOD BLOOD LEFT ARM  Final   Special Requests BOTTLES DRAWN AEROBIC ONLY 5CC  Final   Culture NO GROWTH 2 DAYS  Final   Report Status PENDING  Incomplete  Culture, blood (routine x 2)     Status: None (Preliminary result)   Collection Time: 11/08/15  4:31 PM  Result Value Ref Range Status   Specimen Description BLOOD LEFT WRIST  Final   Special Requests BOTTLES DRAWN AEROBIC ONLY 5CC  Final   Culture NO GROWTH 2 DAYS  Final   Report Status PENDING  Incomplete      Radiology Studies: No results found.   Scheduled Meds: . antiseptic oral rinse  7 mL Mouth Rinse q12n4p  . chlorhexidine  15 mL Mouth Rinse BID  . famotidine (PEPCID) IV  20 mg Intravenous Q12H  . feeding supplement (ENSURE ENLIVE)  237 mL Oral BID BM  . heparin  5,000 Units  Subcutaneous Q8H  . sodium chloride flush  3 mL Intravenous Q12H   Continuous Infusions: . dextrose 75 mL/hr at 11/10/15 1018     LOS: 2 days    Time spent: 30 min    Renae FickleSHORT, Angas Isabell, MD [facility] Pager [phone]  If 7PM-7AM, please contact night-coverage [email or web address] Password [email or web address] 11/10/2015, 7:52 PM

## 2015-11-10 NOTE — Progress Notes (Signed)
   11/10/15 1000  Clinical Encounter Type  Visited With Patient;Health care provider  Visit Type Spiritual support  Referral From Nurse  Spiritual Encounters  Spiritual Needs Prayer  Stress Factors  Patient Stress Factors Health changes;Lack of knowledge  Chaplain made Spiritual care visit, provided patient and staff support, prayer offered.

## 2015-11-10 NOTE — Progress Notes (Signed)
Patient continues to moan and makes sound like a baby. When spoken for patient laughs. Kept dry and comfortable. Incontinent of bowel and bladder.

## 2015-11-10 NOTE — Evaluation (Signed)
Clinical/Bedside Swallow Evaluation Patient Details  Name: [name] MRN: [id number] Date of Birth: 01-16-1924  Today's Date: 11/10/2015 Time: SLP Start Time (ACUTE ONLY): 0950 SLP Stop Time (ACUTE ONLY): 1020 SLP Time Calculation (min) (ACUTE ONLY): 30 min  Past Medical History:  Past Medical History  Diagnosis Date  . Hypertension   . Cardiac failure (HCC)   . Arthritis   . GERD (gastroesophageal reflux disease)   . Dementia   . CKD (chronic kidney disease), stage IV [facility])    Past Surgical History:  Past Surgical History  Procedure Laterality Date  . Cholecystectomy     HPI:  [name] is a 82 y.o. female with a Past Medical History of HTN,GERD, dementia, anxiety, CKD who presents with acute encephalopathy from recurrent UTI w/ enterobacter and elevated trop in the setting of AKI. Pt has not been eating or drinking.    Assessment / Plan / Recommendation Clinical Impression  Pt demonstrates ability to eat and drink and even self feed with hand over hand assistance. SLP provided oral care to remove dried secretions, repositioning, hand over hand assist with visual cues for self feeding. Initially belching noted, but no signs of aspiration. Pt was eager to eat and drink and by end of session belching elicited some mild regurgitation and coughing followed by prolonged crying. Pt may initiate a dys 1 (puree) diet and thin liquids, but with assist for self feeding and esophageal precautions such as fully upright, slow rate with liquids, avoid carbonated beverages if possible. SLP will f/u for tolerance and discussion with family regarding baseline function.     Aspiration Risk  Moderate aspiration risk    Diet Recommendation Dysphagia 1 (Puree);Thin liquid   Liquid Administration via: Cup;Straw Medication Administration: Crushed with puree Supervision: Staff to assist with self feeding Compensations: Slow rate;Small sips/bites Postural Changes: Seated upright at 90  degrees;Remain upright for at least 30 minutes after po intake    Other  Recommendations Oral Care Recommendations: Oral care BID   Follow up Recommendations  Skilled Nursing facility    Frequency and Duration min 2x/week  2 weeks       Prognosis Prognosis for Safe Diet Advancement: Fair Barriers to Reach Goals: Cognitive deficits      Swallow Study   General HPI: [name] is a 82 y.o. female with a Past Medical History of HTN,GERD, dementia, anxiety, CKD who presents with acute encephalopathy from recurrent UTI w/ enterobacter and elevated trop in the setting of AKI. Pt has not been eating or drinking.  Type of Study: Bedside Swallow Evaluation Previous Swallow Assessment: none in chart Diet Prior to this Study: Dysphagia 1 (puree);Thin liquids Temperature Spikes Noted: No Respiratory Status: Room air History of Recent Intubation: No Behavior/Cognition: Doesn't follow directions;Confused;Cooperative;Alert Oral Cavity Assessment: Dry;Dried secretions Oral Care Completed by SLP: Yes Oral Cavity - Dentition: Edentulous Vision: Functional for self-feeding Self-Feeding Abilities: Able to feed self;Needs assist Patient Positioning: Upright in bed Baseline Vocal Quality: Normal Volitional Cough: Cognitively unable to elicit Volitional Swallow: Unable to elicit    Oral/Motor/Sensory Function Overall Oral Motor/Sensory Function: Generalized oral weakness   Ice Chips Ice chips: Not tested   Thin Liquid Thin Liquid: Impaired Presentation: Straw;Cup;Self Fed Pharyngeal  Phase Impairments: Cough - Delayed (belching, regurgitation after significant intake)    Nectar Thick Nectar Thick Liquid: Not tested   Honey Thick Honey Thick Liquid: Not tested   Puree Puree: Within functional limits Presentation: Self Fed;Spoon   Solid   GO  Solid: Not tested       Chloe DittyBonnie Ercell Perlman, MA CCC-SLP [phone]  [name], [name] 11/10/2015,10:23 AM

## 2015-11-10 NOTE — [place] FL2 (Signed)
[place] MEDICAID FL2 LEVEL OF CARE SCREENING TOOL     IDENTIFICATION  Patient Name: [name] Birthdate: 03-Dec-1923 Sex: female Admission Date (Current Location): 11/08/2015  [facility] and [place] Number:  Producer, television/film/video and Address:  [facility], [address] [place], [place] [address]      Provider Number: [id number]  Attending Physician Name and Address:  Renae Fickle, MD  Relative Name and Phone Number:  Jeanie Cooks - Daughter.  [phone]    Current Level of Care: Hospital Recommended Level of Care: Skilled Nursing Facility Prior Approval Number:    Date Approved/Denied:   PASRR Number: [id number] (Eff. 11/10/15)  Discharge Plan: SNF    Current Diagnoses: Patient Active Problem List   Diagnosis Date Noted  . Pressure ulcer 11/09/2015  . Altered mental status 11/08/2015  . Encephalopathy 11/08/2015  . Hypokalemia 11/08/2015  . Hypernatremia 11/08/2015  . GERD (gastroesophageal reflux disease) 11/08/2015  . Dementia 11/08/2015  . Volume depletion 11/08/2015  . Acute kidney injury (HCC)   . Somnolence   . UTI (urinary tract infection) due to Enterococcus     Orientation RESPIRATION BLADDER Height & Weight      (Patient disoriented x4)  Normal Incontinent Weight: 120 lb 8 oz (54.658 kg) Height:   (154.9 cm)  BEHAVIORAL SYMPTOMS/MOOD NEUROLOGICAL BOWEL NUTRITION STATUS      Incontinent Diet (DYS 1)  AMBULATORY STATUS COMMUNICATION OF NEEDS Skin    Unable to ambulate with PT during evaluation on 6/6. Non-Verbally Other (Comment) (Stage 1 pressure ulcer to right/left heel)                       Personal Care Assistance Level of Assistance  Bathing, Feeding, Dressing  Minimum assistance for bathing and dressing. Independent with feeding.         Functional Limitations Info  Sight, Hearing, Speech Sight Info: Adequate Hearing Info: Adequate Speech Info: Impaired (Patient speech is unintelligible)     SPECIAL CARE FACTORS FREQUENCY  PT (By licensed PT), OT (By licensed OT), Speech therapy  PT Evaluation 6/6  A minimum of 2X per week recommended by therapy          Speech Therapy Frequency: Speech swallow eval 11/10/15. DYS 1 diet      Contractures Contractures Info: Not present    Additional Factors Info  Code Status, Allergies Code Status Info: DNR Allergies Info: No known allergies           Current Medications (11/10/2015):  This is the current hospital active medication list Current Facility-Administered Medications  Medication Dose Route Frequency Provider Last Rate Last Dose  . acetaminophen (TYLENOL) tablet 650 mg  650 mg Oral Q6H PRN Meredith Pel, NP       Or  . acetaminophen (TYLENOL) suppository 650 mg  650 mg Rectal Q6H PRN Meredith Pel, NP      . antiseptic oral rinse (CPC / CETYLPYRIDINIUM CHLORIDE 0.05%) solution 7 mL  7 mL Mouth Rinse q12n4p Ozella Rocks, MD   7 mL at 11/10/15 1200  . bisacodyl (DULCOLAX) EC tablet 5 mg  5 mg Oral Daily PRN Meredith Pel, NP      . chlorhexidine (PERIDEX) 0.12 % solution 15 mL  15 mL Mouth Rinse BID Ozella Rocks, MD   15 mL at 11/10/15 1000  . dextrose 5 % solution   Intravenous Continuous Renae Fickle, MD 75 mL/hr at 11/10/15 1018    .  famotidine (PEPCID) IVPB 20 mg premix  20 mg Intravenous Q12H Paula M Guenther, NP 100 mL/hr at 11/10/15 1018 20 mg at 11/10/15 1018  . feeding supplement (ENSURE ENLIVE) (ENSURE ENLIVE) liqMeredith Peluid 237 mL  237 mL Oral BID BM Renae FickleMackenzie Short, MD   237 mL at 11/10/15 1500  . heparin injection 5,000 Units  5,000 Units Subcutaneous Q8H Meredith PelPaula M Guenther, NP   5,000 Units at 11/10/15 [phone]  . ondansetron (ZOFRAN) tablet 4 mg  4 mg Oral Q6H PRN Meredith PelPaula M Guenther, NP       Or  . ondansetron [facility](ZOFRAN) injection 4 mg  4 mg Intravenous Q6H PRN Meredith PelPaula M Guenther, NP      . polyethylene glycol (MIRALAX / GLYCOLAX) packet 17 g  17 g Oral Daily PRN Meredith PelPaula M Guenther, NP      . sodium chloride  flush (NS) 0.9 % injection 3 mL  3 mL Intravenous Q12H Meredith PelPaula M Guenther, NP   3 mL at 11/08/15 1100     Discharge Medications: Please see discharge summary for a list of discharge medications.  Relevant Imaging Results:  Relevant Lab Results:   Additional Information ss#[id number].  Cristobal Goldmannrawford, Hanny Elsberry Bradley, LCSW

## 2015-11-10 NOTE — Progress Notes (Signed)
Initial Nutrition Assessment  DOCUMENTATION CODES:   Not applicable  INTERVENTION:  Provide Ensure Enlive po BID, each supplement provides 350 kcal and 20 grams of protein.  Encourage adequate PO intake.   NUTRITION DIAGNOSIS:   Increased nutrient needs related to acute illness as evidenced by estimated needs.  GOAL:   Patient will meet greater than or equal to 90% of their needs  MONITOR:   PO intake, Supplement acceptance, Diet advancement, Weight trends, Labs, I & O's, Skin  REASON FOR ASSESSMENT:   Low Braden    ASSESSMENT:   82 y.o. female with a Past Medical History of HTN,GERD, dementia, anxiety, CKD who presents with acute encephalopathy from recurrent UTI w/ enterobacter and elevated trop in the setting of AKI.    Pt did not respond to questions asked during time of visit. No family at bedside. RD unable to obtain nutrition history. Per MD note, family would like to pursue continuing IVF and antibiotics but is considering comfort measures only. Pt and family do not want feeding tube. Per SLP evaluation, pt with moderate aspiration risk. Pt is on a dysphagia 1 diet with thin liquids. No recent meal completion recorded. RD to order Ensure to aid in caloric and protein needs. RD to continue to monitor.   Nutrition-Focused physical exam completed. Findings are moderate fat depletion, mild to moderate muscle depletion, and mild edema. Depletion may be associated with the natural aging process.  Labs: Low CO2, calcium, and GFR. High sodium, chloride, BUN, and creatinine.   Diet Order:  DIET - DYS 1 Room service appropriate?: Yes; Fluid consistency:: Thin  Skin:  Wound (see comment) (Stage I on heels)  Last BM:  6/4  Height:   Ht Readings from Last 1 Encounters:  11/08/15 5\' 1"  (1.549 m)    Weight:   Wt Readings from Last 1 Encounters:  11/08/15 120 lb 8 oz (54.658 kg)    Ideal Body Weight:  47.7 kg  BMI:  Body mass index is 22.78 kg/(m^2).  Estimated  Nutritional Needs:   Kcal:  1400-1600  Protein:  65-75 grams  Fluid:  >/= 1.5 L/day  EDUCATION NEEDS:   No education needs identified at this time  Roslyn SmilingStephanie Arnet Hofferber, MS, RD, LDN Pager # [phone] After hours/ weekend pager # [phone]

## 2015-11-10 NOTE — Progress Notes (Signed)
PT Cancellation Note  Patient Details Name: [name] MRN: [id number] DOB: 12/24/1923   Cancelled Treatment:    Reason Eval/Treat Not Completed: Patient at procedure or test/unavailable   Will follow up later today as time allows;  Otherwise, will follow up for PT tomorrow;   Thank you,  Van ClinesHolly Laritza Vokes, PT  Acute Rehabilitation Services Pager [phone] Office [phone]     Van ClinesGarrigan, Eden Rho [facility] 11/10/2015, 2:18 PM

## 2015-11-10 NOTE — Care Management Important Message (Signed)
Important Message  Patient Details  Name: [name] MRN: [id number] Date of Birth: 1923-06-29   Medicare Important Message Given:  Yes    Bernadette HoitShoffner, Karaline Buresh Coleman 11/10/2015, 12:07 PM

## 2015-11-11 DIAGNOSIS — K219 Gastro-esophageal reflux disease without esophagitis: Secondary | ICD-10-CM

## 2015-11-11 LAB — BASIC METABOLIC PANEL
ANION GAP: 11 (ref 5–15)
BUN: 22 mg/dL — ABNORMAL HIGH (ref 6–20)
CO2: 21 mmol/L — AB (ref 22–32)
Calcium: 8.6 mg/dL — ABNORMAL LOW (ref 8.9–10.3)
Chloride: 110 mmol/L (ref 101–111)
Creatinine, Ser: 1.45 mg/dL — ABNORMAL HIGH (ref 0.44–1.00)
GFR calc Af Amer: 36 mL/min — ABNORMAL LOW (ref >60)
GFR calc non Af Amer: 31 mL/min — ABNORMAL LOW (ref >60)
GLUCOSE: 79 mg/dL (ref 65–99)
POTASSIUM: 3.4 mmol/L — AB (ref 3.5–5.1)
Sodium: 142 mmol/L (ref 135–145)

## 2015-11-11 MED ORDER — FAMOTIDINE 20 MG PO TABS: 20.0000 mg | ORAL_TABLET | ORAL | Status: AC

## 2015-11-11 MED ORDER — ENSURE ENLIVE PO LIQD: 237.0000 mL | Freq: Two times a day (BID) | ORAL | Status: AC

## 2015-11-11 MED ORDER — POLYETHYLENE GLYCOL 3350 17 G PO PACK: 17.0000 g | PACK | Freq: Every day | ORAL | Status: AC | PRN

## 2015-11-11 MED ORDER — CETIRIZINE HCL 10 MG PO TABS: 10.0000 mg | ORAL_TABLET | Freq: Every day | ORAL | Status: AC | PRN

## 2015-11-11 MED ORDER — BISACODYL 5 MG PO TBEC: 5.0000 mg | DELAYED_RELEASE_TABLET | Freq: Every day | ORAL | Status: AC | PRN

## 2015-11-11 MED ORDER — FUROSEMIDE 40 MG PO TABS: 40.0000 mg | ORAL_TABLET | Freq: Every day | ORAL | Status: AC | PRN

## 2015-11-11 NOTE — Progress Notes (Signed)
Patient d/c'd via ambulance to [facility].

## 2015-11-11 NOTE — Progress Notes (Signed)
Report called to "Tonya" at [facility]. Daughter notified of transfer. PTAR  notified of request for transportation. IV d/c'd. All belongings gathered. Patient cleaned.

## 2015-11-11 NOTE — Evaluation (Signed)
Physical Therapy Evaluation Patient Details Name: [name] MRN: [id number] DOB: Aug 17, 1923 Today's Date: 11/11/2015   History of Present Illness  [name] is a 82 y.o. female with a Past Medical History of HTN,GERD, dementia, anxiety, CKD who presents with acute encephalopathy from recurrent UTI w/ enterobacter and elevated trop in the setting of AKI. Pt has not been eating or drinking.   Clinical Impression   Pt admitted with above diagnosis. Pt currently with functional limitations due to the deficits listed below (see PT Problem List).  Pt will benefit from skilled PT to increase their independence and safety with mobility to allow discharge to the venue listed below.    Unable to get a good read on PLOF, or how much assist [name] had at ALF; Unsure if she was ambulatory PTA; set gait goal, however if she was not ambulatory, will dc that goal; I favor SNF for 24 hour supervision and assist      Follow Up Recommendations SNF    Equipment Recommendations  Other (comment) (TBD at next venue of care)    Recommendations for Other Services       Precautions / Restrictions Precautions Precautions: Fall      Mobility  Bed Mobility Overal bed mobility: Needs Assistance Bed Mobility: Rolling;Sidelying to Sit Rolling: Max assist Sidelying to sit: Max assist       General bed mobility comments: Max assist for all   Transfers Overall transfer level: Needs assistance   Transfers: Squat Pivot Transfers     Squat pivot transfers: Max assist     General transfer comment: Max assist and knee blocked for stability during basic squat pivot transfer; tactile cueing and facilitation to iniitate with forward weight shift in prep for transfer  Ambulation/Gait                Stairs            Wheelchair Mobility    Modified Rankin (Stroke Patients Only)       Balance Overall balance assessment: Needs assistance Sitting-balance support:  Bilateral upper extremity supported Sitting balance-Leahy Scale: Poor       Standing balance-Leahy Scale: Zero                               Pertinent Vitals/Pain Pain Assessment: Faces Faces Pain Scale: No hurt    Home Living Family/patient expects to be discharged to:: Skilled nursing facility                      Prior Function Level of Independence:  (Unknown)         Comments: From ALF; not sure how much assist they provide     Hand Dominance        Extremity/Trunk Assessment   Upper Extremity Assessment: Generalized weakness           Lower Extremity Assessment: Generalized weakness (Unable to extend bil knees fully)         Communication   Communication: Expressive difficulties;Receptive difficulties (with history of dementia)  Cognition Arousal/Alertness: Awake/alert Behavior During Therapy: WFL for tasks assessed/performed;Impulsive Overall Cognitive Status: No family/caregiver present to determine baseline cognitive functioning Area of Impairment: Orientation Orientation Level: Disoriented to;Place;Time;Situation                  General Comments      Exercises        Assessment/Plan  PT Assessment Patient needs continued PT services (on a trial basis, pending PLOF and ability to participate)  PT Diagnosis Generalized weakness   PT Problem List Decreased strength;Decreased range of motion;Decreased activity tolerance;Decreased balance;Decreased mobility;Decreased coordination;Decreased cognition;Decreased knowledge of use of DME;Decreased safety awareness;Decreased knowledge of precautions  PT Treatment Interventions DME instruction;Gait training;Functional mobility training;Therapeutic activities;Therapeutic exercise;Balance training;Cognitive remediation;Patient/family education (possible Gait training, depending on PLOF)   PT Goals (Current goals can be found in the Care Plan section) Acute Rehab PT  Goals Patient Stated Goal: Unable to state PT Goal Formulation: Patient unable to participate in goal setting Time For Goal Achievement: 11/25/15 Potential to Achieve Goals: Good    Frequency Min 2X/week   Barriers to discharge        Co-evaluation               End of Session Equipment Utilized During Treatment: Gait belt Activity Tolerance: Patient tolerated treatment well Patient left: in chair;with call bell/phone within reach;with chair alarm set (in camera room) Nurse Communication: Mobility status         Time: [id number] PT Time Calculation (min) (ACUTE ONLY): 23 min   Charges:   PT Evaluation $PT Eval Moderate Complexity: 1 Procedure PT Treatments $Therapeutic Activity: 8-22 mins   PT G Codes:        Olen PelGarrigan, Irine Heminger Hamff 11/11/2015, 11:28 AM  Van ClinesHolly Caliope Ruppert, PT  Acute Rehabilitation Services Pager [phone] Office [phone]

## 2015-11-11 NOTE — Clinical Social Work Note (Signed)
Clinical Social Work Assessment  Patient Details  Name: [name] MRN: [id number] Date of Birth: 04/01/1924  Date of referral:  11/10/15               Reason for consult:  Facility Placement                Permission sought to share information with:  Other (Patient disoriented X4) Permission granted to share information::  No (Patient disoriented X4 and has a daughter who is contact and Management consultantdecision maker. )  Name::     Jeanie CooksSusanne Barton  Agency::     Relationship::  Daughter  Contact Information:  [phone]  Housing/Transportation Living arrangements for the past 2 months:  Single Family Home Source of Information:  Adult Children (Daughter Lynnae SandhoffSusanne) Patient Interpreter Needed:  None Criminal Activity/Legal Involvement Pertinent to Current Situation/Hospitalization:  No - Comment as needed Significant Relationships:  Adult Children, Other Family Members Lives with:    Do you feel safe going back to the place where you live?  No (Daughter agrees that patient needs ST rehab before going back home.) Need for family participation in patient care:  Yes (Comment)  Care giving concerns:  After talking with attending MD, daughter agrees with ST rehab for patient.   Social Worker assessment / plan:  CSW talked with patient's daughter Jeanie CooksSusanne Barton [phone]) and her husband by phone regarding discharge planning and recommendation of ST rehab. Daughter in agreement and was provided with skilled facility responses.  [facility] chosen.  Employment status:  Retired Health and safety inspectornsurance information:  Product/process development scientistManaged Care ([employer]) PT Recommendations:  Skilled Nursing Facility Information / Referral to community resources:  Other (Comment Required) (CSW provided daughter with facility responses.)  Patient/Family's Response to care:  Prior to speaking with CSW, daughter was concerned that she had not been able to speak with patient's MD regarding her status. Call was made to MD by CSW and contact made by MD to  daughter.  Patient/Family's Understanding of and Emotional Response to Diagnosis, Current Treatment, and Prognosis:  Not discussed.  Emotional Assessment Appearance:  Appears younger than stated age Attitude/Demeanor/Rapport:  Unable to Assess Affect (typically observed):  Unable to Assess Orientation:   (Patient disoriented X4) Alcohol / Substance use:  Never Used Psych involvement (Current and /or in the community):  No (Comment)  Discharge Needs  Concerns to be addressed:  Discharge Planning Concerns Readmission within the last 30 days:  No Current discharge risk:  None Barriers to Discharge:  No Barriers Identified   Cristobal GoldmannCrawford, Stesha Neyens Bradley, LCSW 11/11/2015, 4:00 PM

## 2015-11-11 NOTE — Discharge Summary (Addendum)
Physician Discharge Summary  [name] ZOX:[id number] DOB: 1923-12-01 DOA: 11/08/2015  PCP: No PCP Per Patient  Admit date: 11/08/2015 Discharge date: 11/11/2015  Recommendations for Outpatient Follow-up:  1. Please assist patient with meals.  2. Ensure 2 times daily 3. Palliative care to follow 4. Physical and occupational therapy 5. Speech therapy  Discharge Diagnoses:  Active Problems:   Altered mental status   Encephalopathy   Hypokalemia   Hypernatremia   GERD (gastroesophageal reflux disease)   Dementia   Volume depletion   Acute kidney injury (HCC)   Somnolence   UTI (urinary tract infection) due to Enterococcus   Pressure ulcer   Discharge Condition: Stable, improved  Diet recommendation: Dysphagia 1 with thin liquids  Dysphagia 1 (Puree);Thin liquid   Liquid Administration via: Cup;Straw Medication Administration: Crushed with puree Supervision: Staff to assist with self feeding Compensations: Slow rate;Small sips/bites Postural Changes: Seated upright at 90 degrees;Remain upright for at least 30 minutes after po intake    Wt Readings from Last 3 Encounters:  11/08/15 54.658 kg (120 lb 8 oz)    History of present illness:   [name] is a 82 y.o. female with dementia, chronic kidney disease, hypertension and GERD. She was seen in ED 11/03/15 with a UTI . Prescribed Keflex. Culture + for > 100,000 enterococcus. Culture reviewed by antimicrobial stewardship pharmacist who felt it was contaminated and no further follow-up required. After being sent back to her ALF, she became increasingly lethargic and refused to eat and drink. Returned to hospital and found to have dehydration with AKI.   Hospital Course:   Metabolic encephalopathy, likely secondary to dehydration. Patient has underlying advanced dementia.Mentation improved with correction of dehydration and electrolyte abnormalities.  Her lasix was discontinued.  She was also treated for  possible UTI initially, however, her repeat urine culture grew insignificant bacteria so her antibiotics were discontinued.  After dehydration and hypernatremia were corrected, she was able to make eye contact and attempts to communicate with occasional words or phrases and gibberish at the time of discharge, which per her family is about her baseline.  I spoke with the family regarding expectations for advanced dementia. She will likely become more debilitated and have difficulty eating, particularly with aspiration. She was seen by speech therapy recommended a dysphagia 1 diet with thin liquids. The family are aware of her risk of aspiration and the possibility of repeated admissions for urinary tract infection and aspiration pneumonia. They were amenable to having palliative care follow at her skilled nursing facility to assist them was transitioned to comfort measures when they feel ready.  Acute on chronic kidney disease stage IV, creatinine trended down from 2.4 to 1.45 with IV fluids.  Renally dose medications.    Hypernatremia to 154, resolved with D5W.  Hypokalemia, resolved with IV and oral supplementation  Hypertension.  Her blood pressure medications were held. She was given IV fluids. Recommended that she not resume her blood pressure medications at the time of discharge.  Behavioral disturbances, resumed her Depakote when she was able to tolerate a diet.    Elevated troponin, likely related to AKI/ CKD. No acute EKG changes.  Telemetry demonstrated normal sinus rhythm.  She did not complain of chest pain during her lucid periods.  Conservative management.  Hyperglycemia secondary to poor oral intake, resolved with dextrose.  GERD.  Recommend famotidine in placed of PPI due to aspiration risk.  Renally dosed.  Stage 1 pressure ulcer on sacrum. Allevyn. PT/OT and OOB  when possible  Procedures:  None  Consultations:  None  Discharge Exam: Filed Vitals:   11/11/15 0917  11/11/15 1430  BP: 113/56 129/56  Pulse: 101 84  Temp: 98.3 F (36.8 C)   Resp: 16 28   Filed Vitals:   11/10/15 2047 11/11/15 0425 11/11/15 0917 11/11/15 1430  BP: 127/54 118/47 113/56 129/56  Pulse: 67 77 101 84  Temp: 97.5 F (36.4 C) 98.2 F (36.8 C) 98.3 F (36.8 C)   TempSrc: Oral Oral Oral   Resp: 16 18 16 28   Height:      Weight:      SpO2:  93% 89% 100%    General: Frail appearing female, sitting in chair, no acute distress, awake and alert and able to make eye contact Cardiovascular: Regular rate and rhythm, 2-6 systolic murmur at the right upper sternal border Respiratory: Clear to auscultation bilaterally Abdomen: NABS, soft, nondistended, mildly tender to palpation diffusely without rebound or guarding MSK: Decreased muscle tone and bulk, no lower extremity edema Psych: Looks at me when I mention her name, but not able to speak words reliably. She does not always recognize all of her family members.  Discharge Instructions      Discharge Instructions    Call MD for:  difficulty breathing, headache or visual disturbances    Complete by:  As directed      Call MD for:  extreme fatigue    Complete by:  As directed      Call MD for:  hives    Complete by:  As directed      Call MD for:  persistant dizziness or light-headedness    Complete by:  As directed      Call MD for:  persistant nausea and vomiting    Complete by:  As directed      Call MD for:  severe uncontrolled pain    Complete by:  As directed      Call MD for:  temperature >100.4    Complete by:  As directed      Increase activity slowly    Complete by:  As directed             Medication List    STOP taking these medications        amLODipine 10 MG tablet  Commonly known as:  NORVASC     cephALEXin 500 MG capsule  Commonly known as:  KEFLEX     clonazePAM 0.5 MG tablet  Commonly known as:  KLONOPIN     metoprolol 50 MG tablet  Commonly known as:  LOPRESSOR     omeprazole 20  MG capsule  Commonly known as:  PRILOSEC     traMADol 50 MG tablet  Commonly known as:  ULTRAM      TAKE these medications        bisacodyl 5 MG EC tablet  Commonly known as:  DULCOLAX  Take 1 tablet (5 mg total) by mouth daily as needed for moderate constipation.     cetirizine 10 MG tablet  Commonly known as:  ZYRTEC  Take 1 tablet (10 mg total) by mouth daily as needed for allergies or rhinitis.     divalproex 125 MG capsule  Commonly known as:  DEPAKOTE SPRINKLE  Take 250 mg by mouth 2 (two) times daily.     famotidine 20 MG tablet  Commonly known as:  PEPCID  Take 1 tablet (20 mg total) by mouth every other day.  feeding supplement (ENSURE ENLIVE) Liqd  Take 237 mLs by mouth 2 (two) times daily between meals.     furosemide 40 MG tablet  Commonly known as:  LASIX  Take 1 tablet (40 mg total) by mouth daily as needed for fluid or edema.     multivitamin with minerals Tabs tablet  Take 1 tablet by mouth daily.     polyethylene glycol packet  Commonly known as:  MIRALAX / GLYCOLAX  Take 17 g by mouth daily as needed for mild constipation.          The results of significant diagnostics from this hospitalization (including imaging, microbiology, ancillary and laboratory) are listed below for reference.    Significant Diagnostic Studies: Ct Head Wo Contrast  11/08/2015  CLINICAL DATA:  Altered mental status EXAM: CT HEAD WITHOUT CONTRAST TECHNIQUE: Contiguous axial images were obtained from the base of the skull through the vertex without intravenous contrast. COMPARISON:  04/27/2014 FINDINGS: There is mild diffuse low-attenuation within the subcortical and periventricular white matter compatible with chronic microvascular disease. This demonstrates significant interval progression when compared with previous exam. Chronic left thalamus lacunar infarct noted. Chronic left cerebellar hemisphere infarct noted. Ex vacuo dilatation of the ventricles and sulci did noted  which also appears progressive from the previous exam. No abnormal extra-axial fluid collection, intracranial hemorrhage or mass identified. IMPRESSION: 1. Progressive small vessel ischemic disease and brain atrophy. 2. No acute intracranial abnormalities. Electronically Signed   By: Signa Kell M.D.   On: 11/08/2015 07:59   Dg Chest Port 1 View  11/03/2015  CLINICAL DATA:  Altered mental status. EXAM: PORTABLE CHEST 1 VIEW COMPARISON:  12/09/2014 FINDINGS: Lung volumes are low. Cardiomediastinal contours are unchanged. Progressive increased interstitial markings from prior. No focal airspace disease. No large pleural effusion or pneumothorax. Chronic change about both shoulders. IMPRESSION: Progressive increased interstitial markings from prior, may be progression of chronic change versus superimposed pulmonary edema. Electronically Signed   By: Rubye Oaks M.D.   On: 11/03/2015 06:28    Microbiology: Recent Results (from the past 240 hour(s))  Urine culture     Status: Abnormal   Collection Time: 11/03/15  6:20 AM  Result Value Ref Range Status   Specimen Description URINE, CATHETERIZED  Final   Special Requests NONE  Final   Culture >=100,000 COLONIES/mL ENTEROCOCCUS SPECIES (A)  Final   Report Status 11/05/2015 FINAL  Final   Organism ID, Bacteria ENTEROCOCCUS SPECIES (A)  Final      Susceptibility   Enterococcus species - MIC*    AMPICILLIN <=2 SENSITIVE Sensitive     LEVOFLOXACIN >=8 RESISTANT Resistant     NITROFURANTOIN <=16 SENSITIVE Sensitive     VANCOMYCIN 1 SENSITIVE Sensitive     * >=100,000 COLONIES/mL ENTEROCOCCUS SPECIES  Urine culture     Status: Abnormal   Collection Time: 11/08/15  7:40 AM  Result Value Ref Range Status   Specimen Description URINE, CATHETERIZED  Final   Special Requests NONE  Final   Culture 6,000 COLONIES/mL INSIGNIFICANT GROWTH (A)  Final   Report Status 11/09/2015 FINAL  Final  Culture, blood (routine x 2)     Status: None (Preliminary  result)   Collection Time: 11/08/15  4:25 PM  Result Value Ref Range Status   Specimen Description BLOOD BLOOD LEFT ARM  Final   Special Requests BOTTLES DRAWN AEROBIC ONLY 5CC  Final   Culture NO GROWTH 2 DAYS  Final   Report Status PENDING  Incomplete  Culture, blood (  routine x 2)     Status: None (Preliminary result)   Collection Time: 11/08/15  4:31 PM  Result Value Ref Range Status   Specimen Description BLOOD LEFT WRIST  Final   Special Requests BOTTLES DRAWN AEROBIC ONLY 5CC  Final   Culture NO GROWTH 2 DAYS  Final   Report Status PENDING  Incomplete     Labs: Basic Metabolic Panel:  Recent Labs Lab 11/08/15 0737 11/08/15 1104 11/09/15 0459 11/10/15 0405 11/11/15 0658  NA 149*  --  154* 148* 142  K 3.2*  --  4.4 3.5 3.4*  CL 111  --  119* 118* 110  CO2 24  --  25 21* 21*  GLUCOSE 101*  --  55* 97 79  BUN 55*  --  44* 31* 22*  CREATININE 2.40*  --  1.91* 1.58* 1.45*  CALCIUM 8.8*  --  8.4* 8.1* 8.6*  MG  --  2.5*  --   --   --    Liver Function Tests:  Recent Labs Lab 11/08/15 0737  AST 18  ALT 11*  ALKPHOS 92  BILITOT 1.0  PROT 6.9  ALBUMIN 3.1*   No results for input(s): LIPASE, AMYLASE in the last 168 hours. No results for input(s): AMMONIA in the last 168 hours. CBC:  Recent Labs Lab 11/08/15 0737 11/09/15 0459 11/10/15 0405  WBC 15.4* 12.8* 9.5  NEUTROABS 11.9*  --   --   HGB 16.2* 15.5* 14.3  HCT 48.5* 48.8* 43.2  MCV 89.5 92.4 89.1  PLT 233 178 180   Cardiac Enzymes:  Recent Labs Lab 11/08/15 0737 11/08/15 1104 11/08/15 1628 11/10/15 0405  TROPONINI 0.12* 0.15* 0.22* 0.06*   BNP: BNP (last 3 results) No results for input(s): BNP in the last 8760 hours.  ProBNP (last 3 results) No results for input(s): PROBNP in the last 8760 hours.  CBG:  Recent Labs Lab 11/09/15 0733 11/09/15 0812 11/09/15 1127 11/09/15 1544 11/09/15 2017  GLUCAP 52* 95 84 82 84    Time coordinating discharge: 35 minutes  Signed:  Sharra Cayabyab,  Hibba Schram  [facility] 11/11/2015, 3:41 PM

## 2015-11-11 NOTE — Clinical Social Work Placement (Signed)
   CLINICAL SOCIAL WORK PLACEMENT  NOTE 11/11/15 - [facility]  Date:  11/11/2015  Patient Details  Name: [name] MRN: [id number] Date of Birth: 12/15/1923  Clinical Social Work is seeking post-discharge placement for this patient at the Skilled  Nursing Facility level of care (*CSW will initial, date and re-position this form in  chart as items are completed):  No   Patient/family provided with [place] Clinical Social Work Department's list of facilities offering this level of care within the geographic area requested by the patient (or if unable, by the patient's family).  Yes   Patient/family informed of their freedom to choose among providers that offer the needed level of care, that participate in Medicare, Medicaid or managed care program needed by the patient, have an available bed and are willing to accept the patient.  No   Patient/family informed of [place]'s ownership interest in [facility] and [facility], as well as of the fact that they are under no obligation to receive care at these facilities.  PASRR submitted to EDS on 11/10/15     PASRR number received on 11/10/15     Existing PASRR number confirmed on       FL2 transmitted to all facilities in geographic area requested by pt/family on 11/11/15     FL2 transmitted to all facilities within larger geographic area on       Patient informed that his/her managed care company has contracts with or will negotiate with certain facilities, including the following:        Yes   Patient/family informed of bed offers received.  [facility]     Physician recommends and patient chooses bed at      [facility]  Patient to be transferred to facility by Ambulance     Patient family notified on 11/11/15 of transfer.  Name of family member notified:  Daughter Jeanie CooksSusanne Barton      PHYSICIAN       Additional Comment:    _______________________________________________ Cristobal Goldmannrawford, Celia Gibbons Bradley, LCSW 11/11/2015, 4:09 PM

## 2015-11-13 LAB — CULTURE, BLOOD (ROUTINE X 2)
CULTURE: NO GROWTH
Culture: NO GROWTH

## 2016-01-06 DEATH — deceased

## 2016-06-20 IMAGING — DX DG CHEST 1V PORT
1 series · 1 of 1 positions shown · non-contrast
Comparison: 12/09/2014

CLINICAL DATA: Altered mental status.

EXAM:
PORTABLE CHEST 1 VIEW

[chest ap]
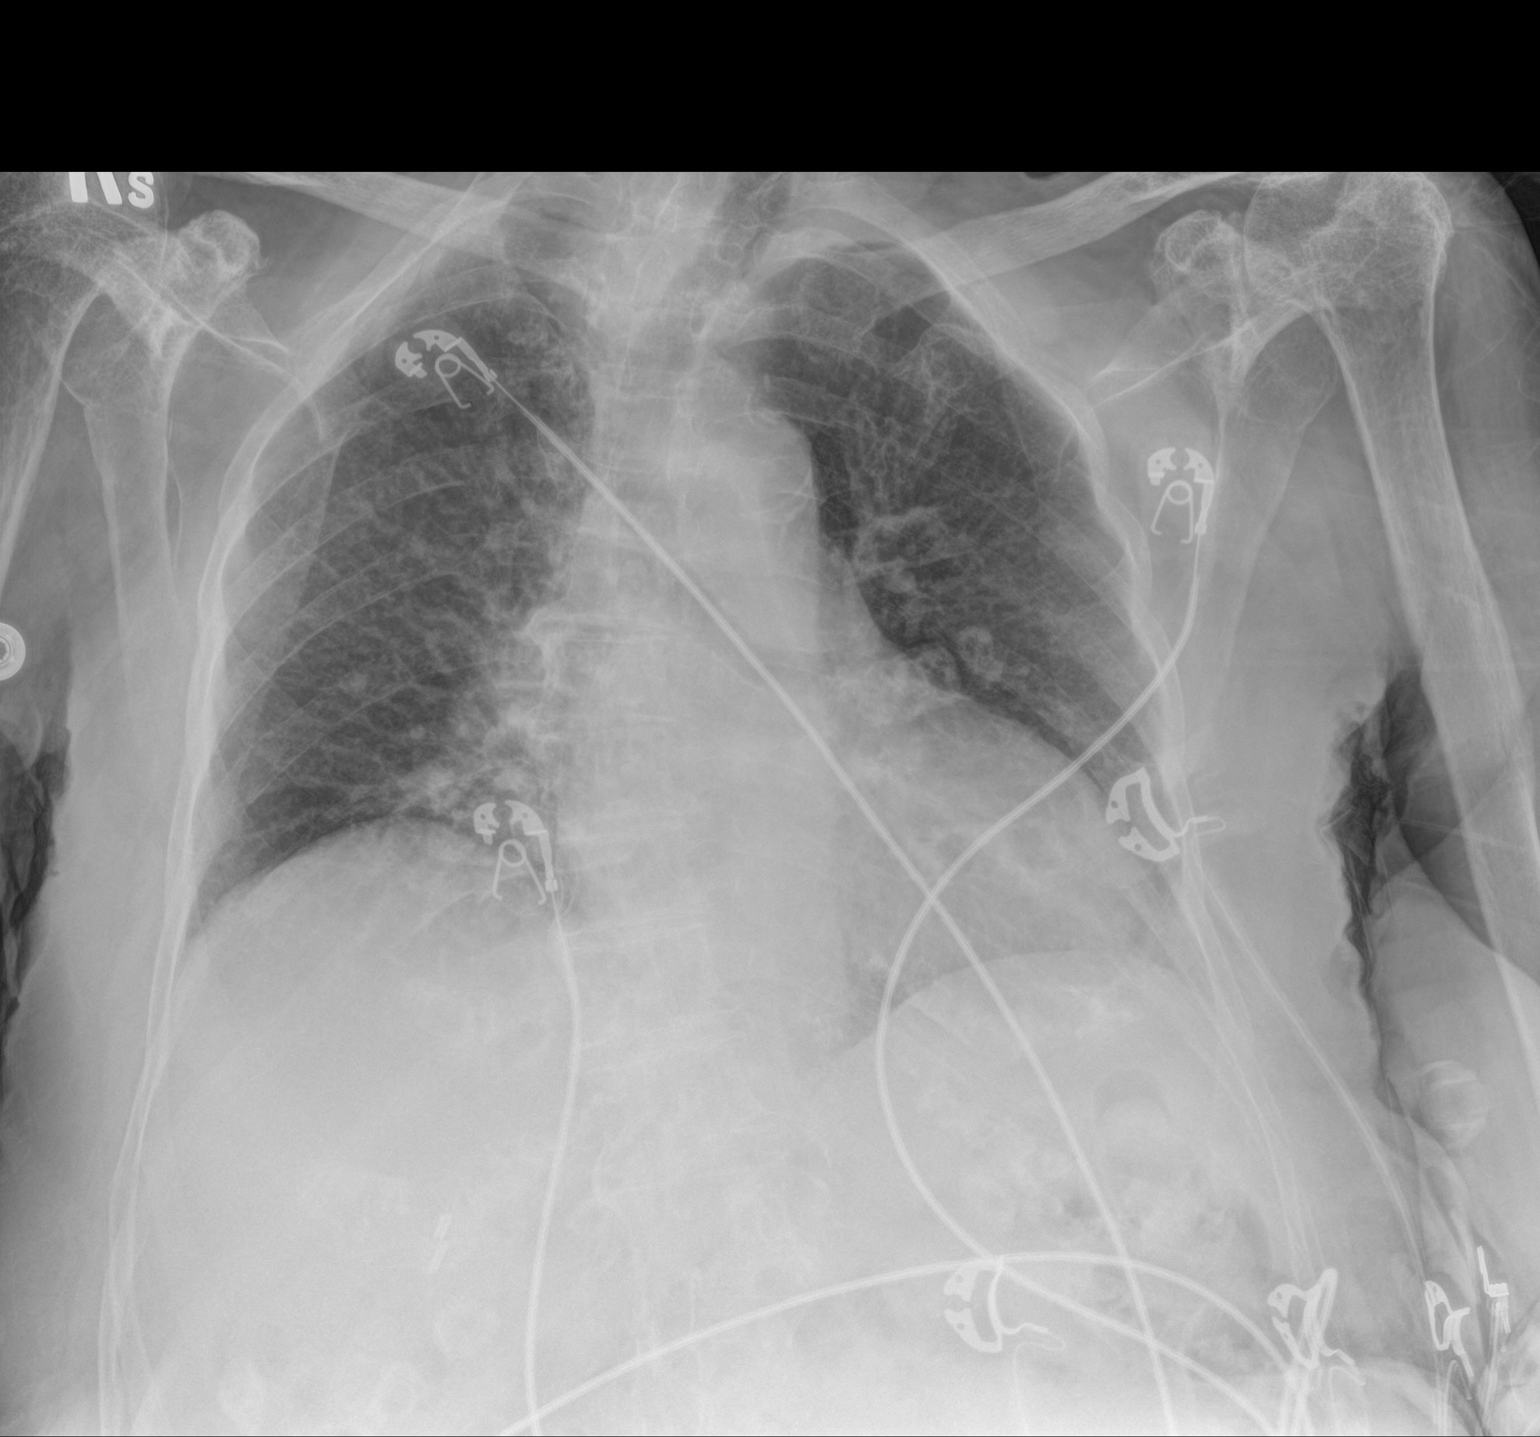

[1 of 1 positions shown; findings below may reference images not displayed]

FINDINGS: Lung volumes are low. Cardiomediastinal contours are unchanged.
Progressive increased interstitial markings from prior. No focal
airspace disease. No large pleural effusion or pneumothorax. Chronic
change about both shoulders.
IMPRESSION: Progressive increased interstitial markings from prior, may be
progression of chronic change versus superimposed pulmonary edema.

## 2016-06-25 IMAGING — CT CT HEAD W/O CM
4 series · 15 of 47 positions shown, 17 images · non-contrast
Comparison: 04/27/2014

CLINICAL DATA: Altered mental status

EXAM:
CT HEAD WITHOUT CONTRAST
TECHNIQUE: Contiguous axial images were obtained from the base of the skull
through the vertex without intravenous contrast.

[Series 2: head without · axial · non-contrast · 0.39mm/px · z∈[+849,+969]mm · 7 of 32 slices shown, 9 images]
[im 4/32  brain]
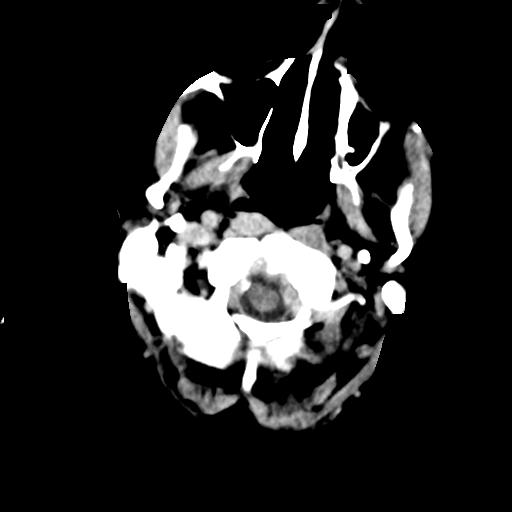
[im 4/32  bone]
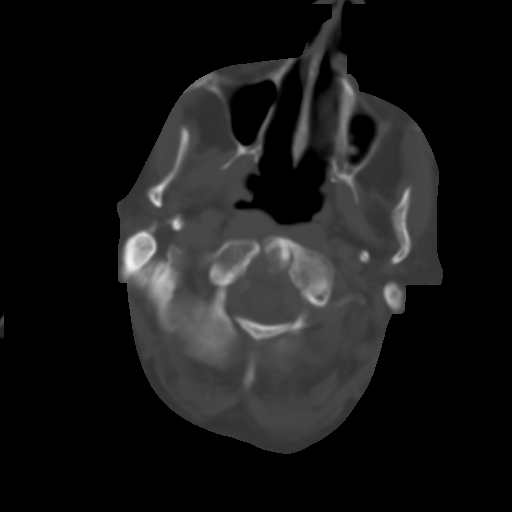
[im 8/32  brain]
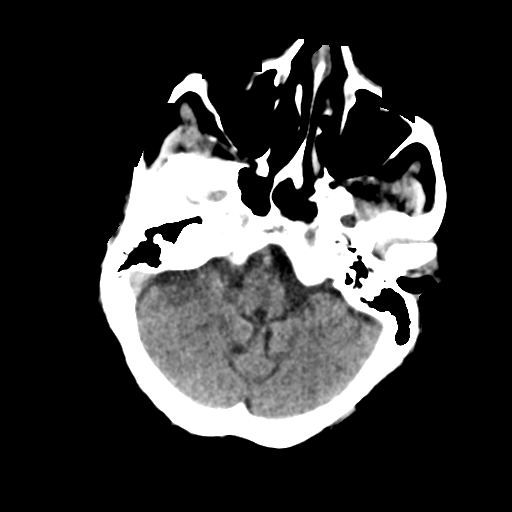
[im 12/32  brain]
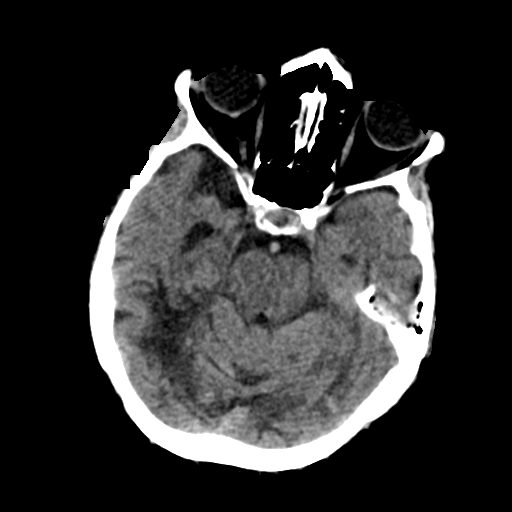
[im 16/32  brain]
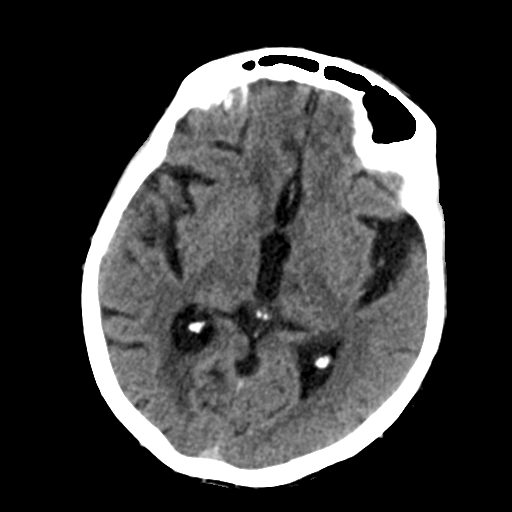
[im 20/32  brain]
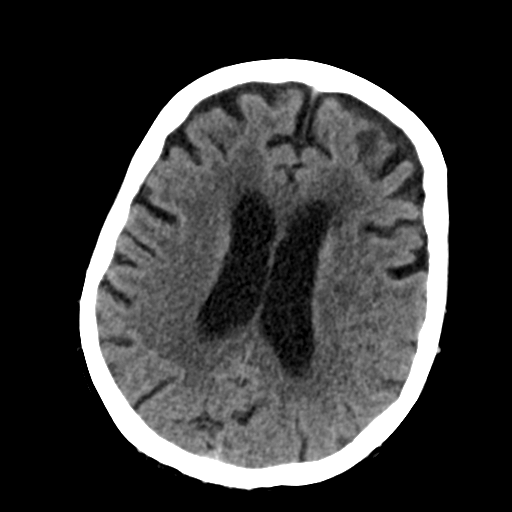
[im 20/32  bone]
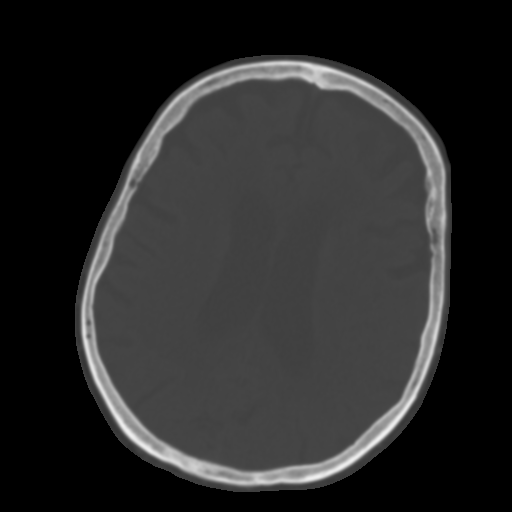
[im 24/32  brain]
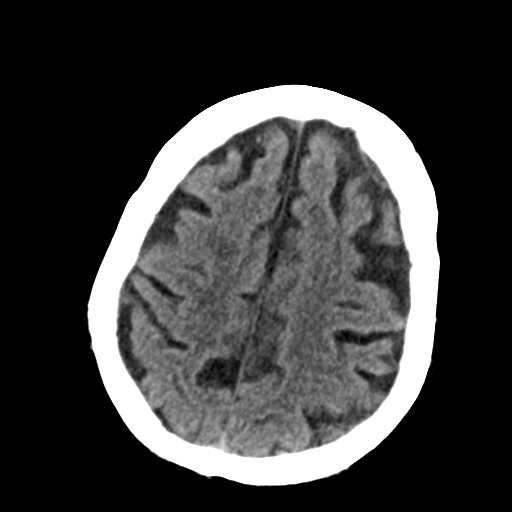
[im 28/32  brain]
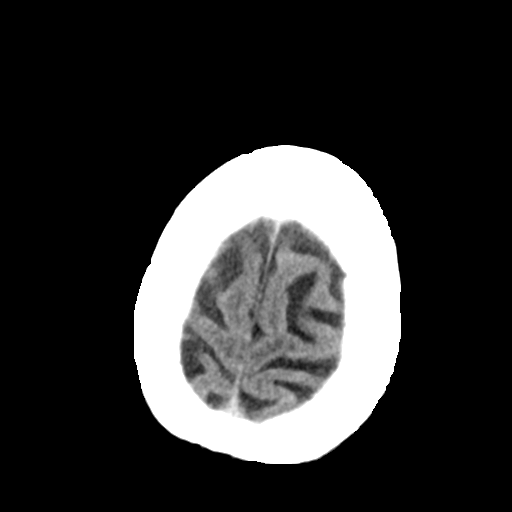

[Series 3: head bone · axial · 0.39mm/px · z∈[+848,+864]mm · 2 of 80 slices shown]
[im 8/80  bone]
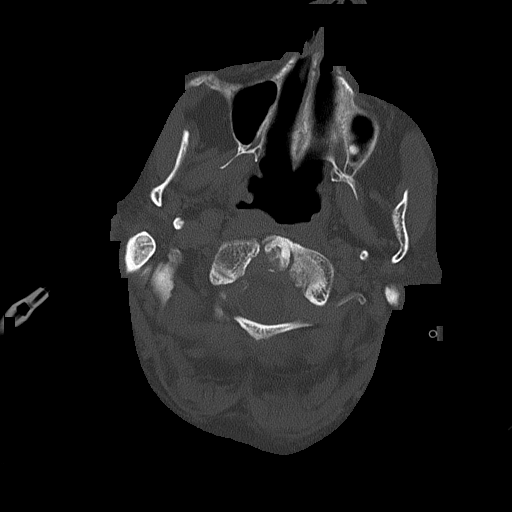
[im 16/80  bone]
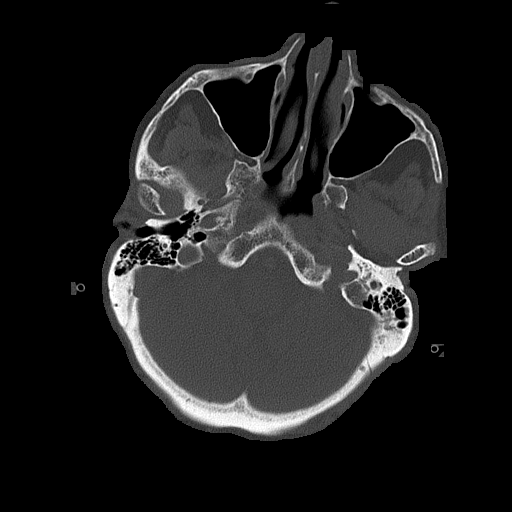

[Series 4: head without cor · coronal · non-contrast · 0.32mm/px · 3 of 61 slices shown]
[im 21/61  brain]
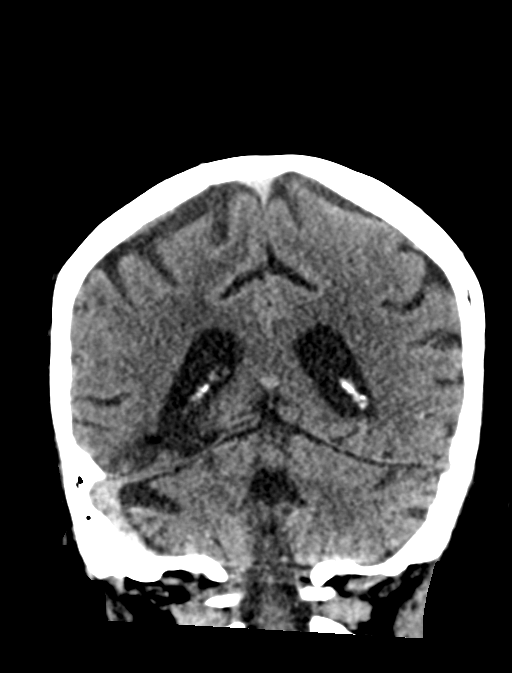
[im 27/61  brain]
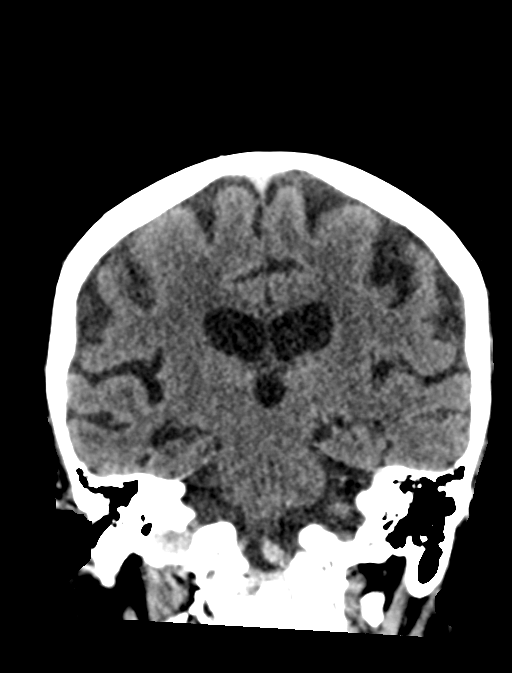
[im 34/61  brain]
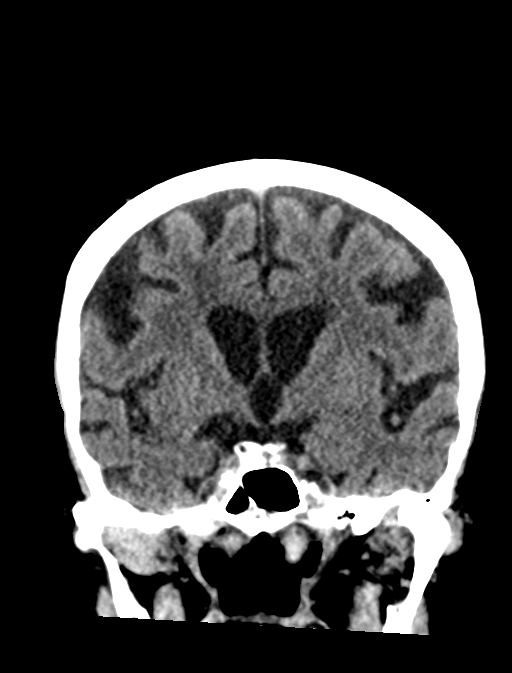

[Series 5: head without sag · sagittal · non-contrast · 0.31mm/px · 3 of 67 slices shown]
[im 23/67  brain]
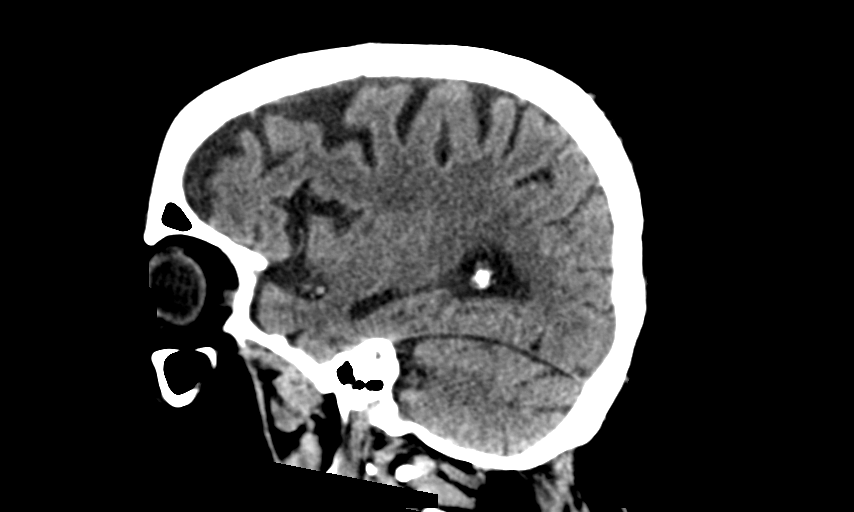
[im 34/67  brain]
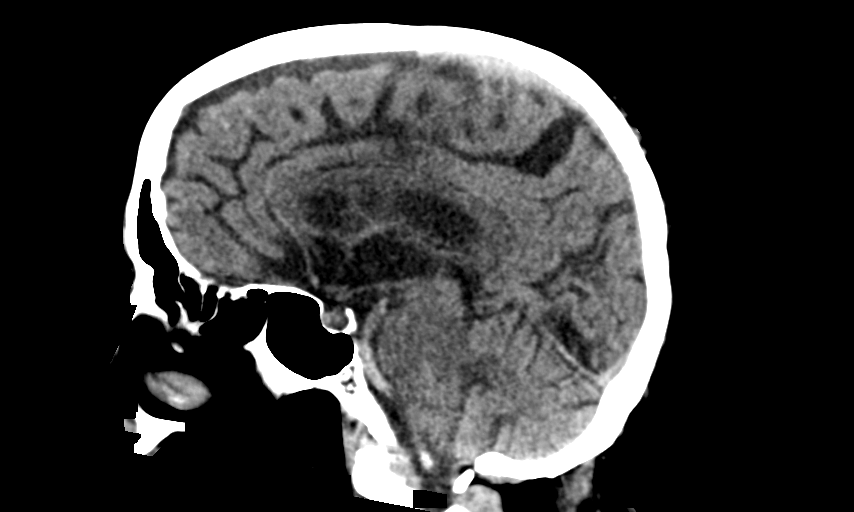
[im 45/67  brain]
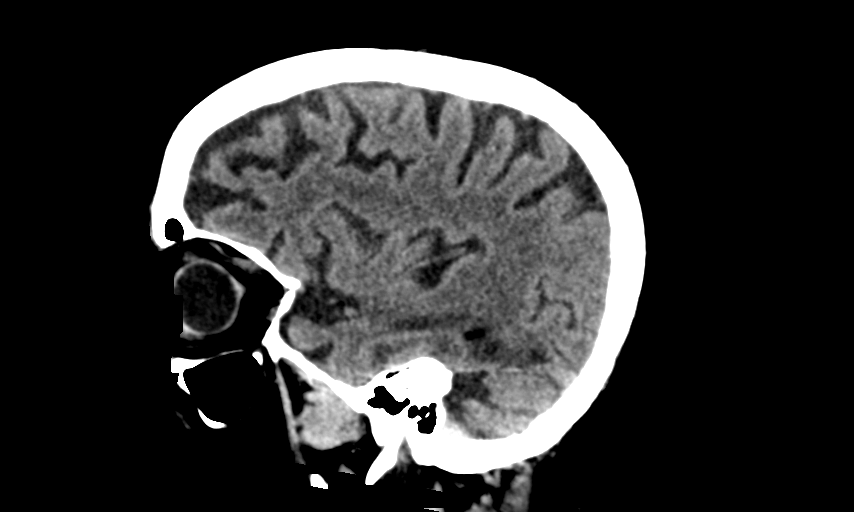

[15 of 47 positions shown; findings below may reference images not displayed]

FINDINGS: There is mild diffuse low-attenuation within the subcortical and
periventricular white matter compatible with chronic microvascular
disease. This demonstrates significant interval progression when
compared with previous exam. Chronic left thalamus lacunar infarct
noted. Chronic left cerebellar hemisphere infarct noted. Ex vacuo
dilatation of the ventricles and sulci did noted which also appears
progressive from the previous exam. No abnormal extra-axial fluid
collection, intracranial hemorrhage or mass identified.
IMPRESSION: 1. Progressive small vessel ischemic disease and brain atrophy.
2. No acute intracranial abnormalities.
